# Patient Record
Sex: Male | Born: 1980 | Race: Black or African American | Hispanic: No | Marital: Married
Health system: Southern US, Community
[De-identification: ages and names within clinical notes are randomized; demographics above are authoritative.]

## PROBLEM LIST (undated history)

## (undated) DIAGNOSIS — K449 Diaphragmatic hernia without obstruction or gangrene: Secondary | ICD-10-CM

## (undated) DIAGNOSIS — K219 Gastro-esophageal reflux disease without esophagitis: Secondary | ICD-10-CM

## (undated) DIAGNOSIS — T7840XA Allergy, unspecified, initial encounter: Secondary | ICD-10-CM

## (undated) HISTORY — DX: Diaphragmatic hernia without obstruction or gangrene: K44.9

## (undated) HISTORY — DX: Gastro-esophageal reflux disease without esophagitis: K21.9

## (undated) HISTORY — DX: Allergy, unspecified, initial encounter: T78.40XA

## (undated) HISTORY — PX: EAR CYST EXCISION: SHX22

---

## 2003-09-02 ENCOUNTER — Emergency Department (HOSPITAL_COMMUNITY): Admission: EM | Admit: 2003-09-02 | Discharge: 2003-09-02 | Payer: Self-pay | Admitting: Emergency Medicine

## 2003-09-12 ENCOUNTER — Emergency Department (HOSPITAL_COMMUNITY): Admission: EM | Admit: 2003-09-12 | Discharge: 2003-09-12 | Payer: Self-pay | Admitting: Emergency Medicine

## 2004-01-15 ENCOUNTER — Ambulatory Visit (HOSPITAL_COMMUNITY): Admission: RE | Admit: 2004-01-15 | Discharge: 2004-01-15 | Payer: Self-pay | Admitting: Orthopedic Surgery

## 2005-01-15 ENCOUNTER — Emergency Department (HOSPITAL_COMMUNITY): Admission: EM | Admit: 2005-01-15 | Discharge: 2005-01-15 | Payer: Self-pay | Admitting: Emergency Medicine

## 2008-03-18 ENCOUNTER — Emergency Department (HOSPITAL_COMMUNITY): Admission: EM | Admit: 2008-03-18 | Discharge: 2008-03-18 | Payer: Self-pay | Admitting: Emergency Medicine

## 2008-05-15 ENCOUNTER — Emergency Department (HOSPITAL_COMMUNITY): Admission: EM | Admit: 2008-05-15 | Discharge: 2008-05-15 | Payer: Self-pay | Admitting: Emergency Medicine

## 2010-09-06 ENCOUNTER — Emergency Department (HOSPITAL_COMMUNITY): Admission: EM | Admit: 2010-09-06 | Discharge: 2010-09-06 | Payer: Self-pay | Admitting: Family Medicine

## 2011-02-03 LAB — POCT URINALYSIS DIPSTICK
Bilirubin Urine: NEGATIVE
Glucose, UA: NEGATIVE mg/dL
Hgb urine dipstick: NEGATIVE
Ketones, ur: NEGATIVE mg/dL
Nitrite: NEGATIVE
Protein, ur: 30 mg/dL — AB
Specific Gravity, Urine: >=1.03 (ref 1.005–1.030)
Urobilinogen, UA: 1 mg/dL (ref 0.0–1.0)
pH: 6.5 (ref 5.0–8.0)

## 2011-02-03 LAB — GC/CHLAMYDIA PROBE AMP, GENITAL
Chlamydia, DNA Probe: NEGATIVE
GC Probe Amp, Genital: NEGATIVE

## 2011-08-17 LAB — POCT URINALYSIS DIP (DEVICE)
Bilirubin Urine: NEGATIVE
Glucose, UA: NEGATIVE
Hgb urine dipstick: NEGATIVE
Ketones, ur: NEGATIVE
Nitrite: NEGATIVE
Operator id: 116391
Protein, ur: NEGATIVE
Specific Gravity, Urine: 1.01
Urobilinogen, UA: 0.2
pH: 6.5

## 2012-04-21 ENCOUNTER — Ambulatory Visit: Payer: 59 | Admitting: Emergency Medicine

## 2012-04-21 VITALS — BP 124/73 | HR 74 | Temp 98.5°F | Resp 16 | Ht 70.5 in | Wt 170.0 lb

## 2012-04-21 DIAGNOSIS — R21 Rash and other nonspecific skin eruption: Secondary | ICD-10-CM

## 2012-04-21 DIAGNOSIS — B081 Molluscum contagiosum: Secondary | ICD-10-CM

## 2012-04-21 NOTE — Patient Instructions (Signed)
Molluscum Contagiosum Molluscum contagiosum is a viral infection of the skin that causes smooth surfaced, firm, small (3 to 5 mm), dome-shaped bumps (papules) which are flesh-colored. The bumps usually do not hurt or itch. In children, they most often appear on the face, trunk, arms and legs. In adults, the growths are commonly found on the genitals, thighs, face, neck, and belly (abdomen). The infection may be spread to others by close (skin to skin) contact (such as occurs in schools and swimming pools), sharing towels and clothing, and through sexual contact. The bumps usually disappear without treatment in 2 to 4 months, especially in children. You may have them treated to avoid spreading them. Scraping (curetting) the middle part (central plug) of the bump with a needle or sharp curette, or application of liquid nitrogen for 8 or 9 seconds usually cures the infection. HOME CARE INSTRUCTIONS   Do not scratch the bumps. This may spread the infection to other parts of the body and to other people.   Avoid close contact with others, including sexual contact, until the bumps disappear. Do not share towels or clothing.   If liquid nitrogen was used, blisters will form. Leave the blisters alone and cover with a bandage. The tops will fall off by themselves in 7 to 14 days.   Four months without a lesion is usually a cure.  SEEK IMMEDIATE MEDICAL CARE IF:  You have a fever.   You develop swelling, redness, pain, tenderness, or warmth in the areas of the bumps. They may be infected.  Document Released: 11/05/2000 Document Revised: 10/28/2011 Document Reviewed: 04/18/2009 [employer] Patient Information 2012 [employer]. 

## 2012-04-21 NOTE — Progress Notes (Signed)
  Subjective:    Patient ID: Jimmy Orozco, male    DOB: [birth year], 31 y.o.   MRN: [id number]  HPI patient here with a slightly pruritic rash involving both forearms. He takes is again class II child and wife do not have any bumps    Review of Systems     Objective:   Physical Exam Days red areas on both forearms. There is central umbilication noted. There is no excoriation noted. The web spaces are normal.       Assessment & Plan:  These had the appearance of molluscum I do not feel like these are a scabietic lesions. I suspect he picked them up at the gym.

## 2012-07-28 ENCOUNTER — Ambulatory Visit: Payer: 59 | Admitting: Emergency Medicine

## 2012-07-28 VITALS — BP 118/76 | HR 80 | Temp 98.2°F | Resp 16 | Ht 69.0 in | Wt 177.4 lb

## 2012-07-28 DIAGNOSIS — J40 Bronchitis, not specified as acute or chronic: Secondary | ICD-10-CM

## 2012-07-28 DIAGNOSIS — J018 Other acute sinusitis: Secondary | ICD-10-CM

## 2012-07-28 MED ORDER — AMOXICILLIN-POT CLAVULANATE 875-125 MG PO TABS: 1.0000 | ORAL_TABLET | Freq: Two times a day (BID) | ORAL | Status: AC

## 2012-07-28 MED ORDER — PSEUDOEPHEDRINE-GUAIFENESIN ER 60-600 MG PO TB12: 1.0000 | ORAL_TABLET | Freq: Two times a day (BID) | ORAL | Status: DC

## 2012-07-28 NOTE — Progress Notes (Signed)
    Date:  07/28/2012   Name:  Jimmy Orozco   DOB:  26-Aug-1981   MRN:  [id number] Gender: male Age: 31 y.o.  PCP:  No primary provider on file.    Chief Complaint: Sinusitis   History of Present Illness:  Jimmy Orozco is a 31 y.o. pleasant patient who presents with the following:  Ill with sinus complaints since July 4th weekend when he was in [place].  Has nasal congestion, post nasal drainage and a cough productive of foul tasting sputum that never makes it out of his mouth.  No fever or chills. Pressure in cheeks and upper teeth.  No nausea or vomiting. No rash or stool change.  Has a pustular lesion on his scrotum that has increased in size.    There is no problem list on file for this patient.   No past medical history on file.  No past surgical history on file.  History  Substance Use Topics  . Smoking status: Never Smoker   . Smokeless tobacco: Not on file  . Alcohol Use: Not on file    No family history on file.  Allergies  Allergen Reactions  . Iodine     Medication list has been reviewed and updated.  No current outpatient prescriptions on file prior to visit.    Review of Systems:  As per HPI, otherwise negative.    Physical Examination: Filed Vitals:   07/28/12 1754  BP: 118/76  Pulse: 80  Temp: 98.2 F (36.8 C)  Resp: 16   Filed Vitals:   07/28/12 1754  Height: 5\' 9"  (1.753 m)  Weight: 177 lb 6.4 oz (80.468 kg)   Body mass index is 26.20 kg/(m^2). Ideal Body Weight: Weight in (lb) to have BMI = 25: 168.9    GEN: WDWN, NAD, Non-toxic, Alert & Oriented x 3 HEENT: Atraumatic, Normocephalic. Oropharynx negative Ears and Nose: No external deformity. TM Negative.  Green nasal drainage with mucosal edema CHest:  CTA BS= EXTR: No clubbing/cyanosis/edema NEURO: Normal gait.  PSYCH: Normally interactive. Conversant. Not depressed or anxious appearing.  Calm demeanor.  Genitalia:  Normal male circumcised.  Pustule on  scrotum  Assessment and Plan: Sinusitis Infected sebaceous cyst scrotum augmentin mucinex Follow up as needed  Carmelina Dane, MD

## 2012-08-11 ENCOUNTER — Ambulatory Visit: Payer: 59 | Admitting: Emergency Medicine

## 2012-08-11 ENCOUNTER — Ambulatory Visit: Payer: 59

## 2012-08-11 VITALS — BP 116/72 | HR 76 | Temp 98.4°F | Resp 16 | Ht 69.0 in | Wt 174.0 lb

## 2012-08-11 DIAGNOSIS — M79672 Pain in left foot: Secondary | ICD-10-CM

## 2012-08-11 DIAGNOSIS — M79609 Pain in unspecified limb: Secondary | ICD-10-CM

## 2012-08-11 DIAGNOSIS — S9780XA Crushing injury of unspecified foot, initial encounter: Secondary | ICD-10-CM

## 2012-08-11 DIAGNOSIS — Z23 Encounter for immunization: Secondary | ICD-10-CM

## 2012-08-11 MED ORDER — NAPROXEN SODIUM 550 MG PO TABS: 550.0000 mg | ORAL_TABLET | Freq: Two times a day (BID) | ORAL | Status: DC

## 2012-08-11 NOTE — Progress Notes (Signed)
  Date:  08/11/2012   Name:  Jimmy Orozco   DOB:  Jun 30, 1981   MRN:  [id number] Gender: male Age: 31 y.o.  PCP:  Katy Apo, MD    Chief Complaint: Foot Injury   History of Present Illness:  Jimmy Orozco is a 31 y.o. pleasant patient who presents with the following:  Injured yesterday afternoon when a cart rolled over his foot.  Says that he soaked his foot in hot water and it was worse and then his wife had him put ice on it and it improved.  Still unable to easily bear weight.  Denies other complaints.   There is no problem list on file for this patient.   No past medical history on file.  No past surgical history on file.  History  Substance Use Topics  . Smoking status: Never Smoker   . Smokeless tobacco: Not on file  . Alcohol Use: Not on file    No family history on file.  Allergies  Allergen Reactions  . Iodine     Medication list has been reviewed and updated.  Outpatient Prescriptions Prior to Visit  Medication Sig Dispense Refill  . pseudoephedrine-guaifenesin (MUCINEX D) 60-600 MG per tablet Take 1 tablet by mouth every 12 (twelve) hours.  18 tablet  0    Review of Systems:  As per HPI, otherwise negative.    Physical Examination: Filed Vitals:   08/11/12 0934  BP: 116/72  Pulse: 76  Temp: 98.4 F (36.9 C)  Resp: 16   Filed Vitals:   08/11/12 0934  Height: 5\' 9"  (1.753 m)  Weight: 174 lb (78.926 kg)   Body mass index is 25.70 kg/(m^2). Ideal Body Weight: Weight in (lb) to have BMI = 25: 168.9    GEN: WDWN, NAD, Non-toxic, Alert & Oriented x 3 HEENT: Atraumatic, Normocephalic.  Ears and Nose: No external deformity. EXTR: No clubbing/cyanosis/edema NEURO: Normal gait.  PSYCH: Normally interactive. Conversant. Not depressed or anxious appearing.  Calm demeanor.  Foot:  Tender dorsum of left foot.  No deformity or ecchymosis.  NATI.  Ankle stable and not tender.  Assessment and Plan: Contusion  foot Anaprox Vedia Coffer, MD  [facility] reading (PRIMARY) by  Dr. Dareen Piano No fracture.  I have reviewed and agree with documentation. Robert P. Merla Riches, M.D.

## 2013-06-28 ENCOUNTER — Ambulatory Visit (INDEPENDENT_AMBULATORY_CARE_PROVIDER_SITE_OTHER): Payer: 59 | Admitting: Family Medicine

## 2013-06-28 VITALS — BP 122/84 | HR 70 | Temp 98.0°F | Resp 16 | Ht 70.0 in | Wt 177.8 lb

## 2013-06-28 DIAGNOSIS — R12 Heartburn: Secondary | ICD-10-CM

## 2013-06-28 MED ORDER — RANITIDINE HCL 75 MG PO TABS: 75.0000 mg | ORAL_TABLET | Freq: Two times a day (BID) | ORAL | Status: DC

## 2013-06-28 NOTE — Progress Notes (Signed)
Subjective:    Patient ID: Jimmy Orozco, male    DOB: [birth year], 32 y.o.   MRN: [id number]  HPI Jimmy Orozco is a 32 y.o. male  Past few weeks - burning sensation in L chest.  Notices with eating at times. Unsure if specific foods.  Notes sometimes in the am - feeling of something in chest/stuck, then resolves on own. No dyspnea, /n/v/diaphoresis. No arm or neck radiation. Does have some belching at times. Has had water brash. No recent NSAIDs. otc MVI.  Eats middle of night at times. No recent calf pain or swelling.  SH:  Less than 1 caffeinated drink per day.  Dose eat [employer] cups - at least 4 per day. Has been eating more recently. Nonsmoker, but 2nd hand smoke. Does eat fried foods/fast foods.  Spicy food at times. Wine on weekends - 3-4 per day. None during the week.   Tx: none.   FH: no cardiac disease/MI/CAD, no FH of sudden death.   Review of Systems  Constitutional: Negative for fever, chills, diaphoresis, appetite change, fatigue and unexpected weight change.  Respiratory: Negative for cough, chest tightness and shortness of breath.   Cardiovascular: Positive for chest pain (burning. ). Negative for palpitations and leg swelling.  Gastrointestinal: Negative for nausea, vomiting, abdominal pain, diarrhea, blood in stool and anal bleeding.  Musculoskeletal: Negative for back pain and joint swelling.  Skin: Negative for rash.       Objective:   Physical Exam  Vitals reviewed. Constitutional: He is oriented to person, place, and time. He appears well-developed and well-nourished.  HENT:  Head: Normocephalic and atraumatic.  Eyes: EOM are normal. Pupils are equal, round, and reactive to light.  Neck: No JVD present. Carotid bruit is not present.  Cardiovascular: Normal rate, regular rhythm and normal heart sounds.   No murmur heard. Pulmonary/Chest: Effort normal and breath sounds normal. He has no rales.  nontender   Abdominal: Soft. Bowel sounds are normal. He  exhibits no distension. There is no tenderness.  Musculoskeletal: He exhibits no edema.  Neurological: He is alert and oriented to person, place, and time.  Skin: Skin is warm and dry.  Psychiatric: He has a normal mood and affect.      Assessment & Plan:  [name] is a 32 y.o. male Heartburn - Plan: ranitidine (ZANTAC) 75 MG tablet  Discussed decrease in chocolate, other trigger foods, trial of short term zantac and if not improving in 4-5 days - recheck. RTC/ER precautions discussed.   Meds ordered this encounter  Medications  . ranitidine (ZANTAC) 75 MG tablet    Sig: Take 1 tablet (75 mg total) by mouth 2 (two) times daily.    Dispense:  30 tablet    Refill:  0   , Patient Instructions  Try zantac twice per day, avoid spicy foods, fried/fast foods as much as possible. Avoid caffeine - including chocolate.  Other instructions below. Return to the clinic or go to the nearest emergency room if any of your symptoms worsen or new symptoms occur, and if not improving with above in next 4-5 days - recheck - sooner if worse.   Heartburn Heartburn is a painful, burning sensation in the chest. It may feel worse in certain positions, such as lying down or bending over. It is caused by stomach acid backing up into the tube that carries food from the mouth down to the stomach (lower esophagus).  CAUSES   Large meals.  Certain foods and  drinks.  Exercise.  Increased acid production.  Being overweight or obese.  Certain medicines. SYMPTOMS   Burning pain in the chest or lower throat.  Bitter taste in the mouth.  Coughing. DIAGNOSIS  If the usual treatments for heartburn do not improve your symptoms, then tests may be done to see if there is another condition present. Possible tests may include:  X-rays.  Endoscopy. This is when a tube with a light and a camera on the end is used to examine the esophagus and the stomach.  A test to measure the amount of acid in the  esophagus (pH test).  A test to see if the esophagus is working properly (esophageal manometry).  Blood, breath, or stool tests to check for bacteria that cause ulcers. TREATMENT   Your caregiver may tell you to use certain over-the-counter medicines (antacids, acid reducers) for mild heartburn.  Your caregiver may prescribe medicines to decrease the acid in your stomach or protect your stomach lining.  Your caregiver may recommend certain diet changes.  For severe cases, your caregiver may recommend that the head of your bed be elevated on blocks. (Sleeping with more pillows is not an effective treatment as it only changes the position of your head and does not improve the main problem of stomach acid refluxing into the esophagus.) HOME CARE INSTRUCTIONS   Take all medicines as directed by your caregiver.  Raise the head of your bed by putting blocks under the legs if instructed to by your caregiver.  Do not exercise right after eating.  Avoid eating 2 or 3 hours before bed. Do not lie down right after eating.  Eat small meals throughout the day instead of 3 large meals.  Stop smoking if you smoke.  Maintain a healthy weight.  Identify foods and beverages that make your symptoms worse and avoid them. Foods you may want to avoid include:  Peppers.  Chocolate.  High-fat foods, including fried foods.  Spicy foods.  Garlic and onions.  Citrus fruits, including oranges, grapefruit, lemons, and limes.  Food containing tomatoes or tomato products.  Mint.  Carbonated drinks, caffeinated drinks, and alcohol.  Vinegar. SEEK IMMEDIATE MEDICAL CARE IF:  You have severe chest pain that goes down your arm or into your jaw or neck.  You feel sweaty, dizzy, or lightheaded.  You are short of breath.  You vomit blood.  You have difficulty or pain with swallowing.  You have bloody or black, tarry stools.  You have episodes of heartburn more than 3 times a week for more  than 2 weeks. MAKE SURE YOU:  Understand these instructions.  Will watch your condition.  Will get help right away if you are not doing well or get worse. Document Released: 03/27/2009 Document Revised: 01/31/2012 Document Reviewed: 04/25/2011 [facility] Patient Information 2014 [place], [place].

## 2013-06-28 NOTE — Patient Instructions (Signed)
Try zantac twice per day, avoid spicy foods, fried/fast foods as much as possible. Avoid caffeine - including chocolate.  Other instructions below. Return to the clinic or go to the nearest emergency room if any of your symptoms worsen or new symptoms occur, and if not improving with above in next 4-5 days - recheck - sooner if worse.   Heartburn Heartburn is a painful, burning sensation in the chest. It may feel worse in certain positions, such as lying down or bending over. It is caused by stomach acid backing up into the tube that carries food from the mouth down to the stomach (lower esophagus).  CAUSES   Large meals.  Certain foods and drinks.  Exercise.  Increased acid production.  Being overweight or obese.  Certain medicines. SYMPTOMS   Burning pain in the chest or lower throat.  Bitter taste in the mouth.  Coughing. DIAGNOSIS  If the usual treatments for heartburn do not improve your symptoms, then tests may be done to see if there is another condition present. Possible tests may include:  X-rays.  Endoscopy. This is when a tube with a light and a camera on the end is used to examine the esophagus and the stomach.  A test to measure the amount of acid in the esophagus (pH test).  A test to see if the esophagus is working properly (esophageal manometry).  Blood, breath, or stool tests to check for bacteria that cause ulcers. TREATMENT   Your caregiver may tell you to use certain over-the-counter medicines (antacids, acid reducers) for mild heartburn.  Your caregiver may prescribe medicines to decrease the acid in your stomach or protect your stomach lining.  Your caregiver may recommend certain diet changes.  For severe cases, your caregiver may recommend that the head of your bed be elevated on blocks. (Sleeping with more pillows is not an effective treatment as it only changes the position of your head and does not improve the main problem of stomach acid  refluxing into the esophagus.) HOME CARE INSTRUCTIONS   Take all medicines as directed by your caregiver.  Raise the head of your bed by putting blocks under the legs if instructed to by your caregiver.  Do not exercise right after eating.  Avoid eating 2 or 3 hours before bed. Do not lie down right after eating.  Eat small meals throughout the day instead of 3 large meals.  Stop smoking if you smoke.  Maintain a healthy weight.  Identify foods and beverages that make your symptoms worse and avoid them. Foods you may want to avoid include:  Peppers.  Chocolate.  High-fat foods, including fried foods.  Spicy foods.  Garlic and onions.  Citrus fruits, including oranges, grapefruit, lemons, and limes.  Food containing tomatoes or tomato products.  Mint.  Carbonated drinks, caffeinated drinks, and alcohol.  Vinegar. SEEK IMMEDIATE MEDICAL CARE IF:  You have severe chest pain that goes down your arm or into your jaw or neck.  You feel sweaty, dizzy, or lightheaded.  You are short of breath.  You vomit blood.  You have difficulty or pain with swallowing.  You have bloody or black, tarry stools.  You have episodes of heartburn more than 3 times a week for more than 2 weeks. MAKE SURE YOU:  Understand these instructions.  Will watch your condition.  Will get help right away if you are not doing well or get worse. Document Released: 03/27/2009 Document Revised: 01/31/2012 Document Reviewed: 04/25/2011 [employer] Patient Information 2014  [employer].

## 2013-06-30 ENCOUNTER — Emergency Department (HOSPITAL_COMMUNITY): Payer: 59

## 2013-06-30 ENCOUNTER — Emergency Department (HOSPITAL_COMMUNITY)
Admission: EM | Admit: 2013-06-30 | Discharge: 2013-06-30 | Disposition: A | Discharge status: Home / Self Care | Payer: 59 | Attending: Emergency Medicine | Admitting: Emergency Medicine

## 2013-06-30 ENCOUNTER — Encounter (HOSPITAL_COMMUNITY): Payer: Self-pay | Admitting: Emergency Medicine

## 2013-06-30 DIAGNOSIS — R079 Chest pain, unspecified: Secondary | ICD-10-CM

## 2013-06-30 DIAGNOSIS — R0789 Other chest pain: Secondary | ICD-10-CM | POA: Insufficient documentation

## 2013-06-30 LAB — BASIC METABOLIC PANEL
BUN: 13 mg/dL (ref 6–23)
CO2: 27 mEq/L (ref 19–32)
Calcium: 9.4 mg/dL (ref 8.4–10.5)
Chloride: 98 mEq/L (ref 96–112)
Creatinine, Ser: 1.09 mg/dL (ref 0.50–1.35)
GFR calc Af Amer: >90 mL/min (ref >90)
GFR calc non Af Amer: 88 mL/min — ABNORMAL LOW (ref >90)
Glucose, Bld: 78 mg/dL (ref 70–99)
Potassium: 3.5 mEq/L (ref 3.5–5.1)
Sodium: 137 mEq/L (ref 135–145)

## 2013-06-30 LAB — CBC
HCT: 41.5 % (ref 39.0–52.0)
Hemoglobin: 13.8 g/dL (ref 13.0–17.0)
MCH: 28.8 pg (ref 26.0–34.0)
MCHC: 33.3 g/dL (ref 30.0–36.0)
MCV: 86.5 fL (ref 78.0–100.0)
Platelets: 239 10*3/uL (ref 150–400)
RBC: 4.8 MIL/uL (ref 4.22–5.81)
RDW: 13.5 % (ref 11.5–15.5)
WBC: 4.7 10*3/uL (ref 4.0–10.5)

## 2013-06-30 LAB — POCT I-STAT TROPONIN I: Troponin i, poc: 0 ng/mL (ref 0.00–0.08)

## 2013-06-30 MED ORDER — OMEPRAZOLE 20 MG PO CPDR: 20.0000 mg | DELAYED_RELEASE_CAPSULE | Freq: Every day | ORAL | Status: DC

## 2013-06-30 NOTE — ED Provider Notes (Signed)
CSN: [id number]     Arrival date & time 06/30/13  1746 History     First MD Initiated Contact with Patient 06/30/13 1759     Chief Complaint  Patient presents with  . Chest Pain   (Consider location/radiation/quality/duration/timing/severity/associated sxs/prior Treatment) HPI Comments: Pt comes in with cc of chest pain. Chest pain x 2 weeks, went to an urgent care, and started on antacids, that has taken the burning away. The pain is intermittent, unprovoked, and lasts for few seconds. The pain is left sided, difficult to explain. No associated nausea, emesis, diaphoresis. Pt is active, and has no premature CAD, and doesn't smoke, or use drugs.  Patient is a 32 y.o. male presenting with chest pain. The history is provided by the patient.  Chest Pain Associated symptoms: no cough, no dizziness, no fever, no headache and no shortness of breath     History reviewed. No pertinent past medical history. History reviewed. No pertinent past surgical history. Family History  Problem Relation Age of Onset  . Diabetes Mother   . Hypertension Mother   . Hypertension Father   . Diabetes Maternal Grandmother   . Hypertension Paternal Grandfather    History  Substance Use Topics  . Smoking status: Never Smoker   . Smokeless tobacco: Not on file  . Alcohol Use: Not on file    Review of Systems  Constitutional: Negative for fever, chills and activity change.  HENT: Negative for neck pain.   Eyes: Negative for visual disturbance.  Respiratory: Negative for cough, chest tightness and shortness of breath.   Cardiovascular: Positive for chest pain.  Gastrointestinal: Negative for abdominal distention.  Genitourinary: Negative for dysuria, enuresis and difficulty urinating.  Musculoskeletal: Negative for arthralgias.  Neurological: Negative for dizziness, light-headedness and headaches.  Psychiatric/Behavioral: Negative for confusion.    Allergies  Iodine  Home Medications   Current  Outpatient Rx  Name  Route  Sig  Dispense  Refill  . ranitidine (ZANTAC) 150 MG tablet   Oral   Take 150 mg by mouth 2 (two) times daily.          BP 150/81  Pulse 86  Temp(Src) 98.2 F (36.8 C)  Resp 20  SpO2 100% Physical Exam  Nursing note and vitals reviewed. Constitutional: He is oriented to person, place, and time. He appears well-developed.  HENT:  Head: Normocephalic and atraumatic.  Eyes: Conjunctivae and EOM are normal. Pupils are equal, round, and reactive to light.  Neck: Normal range of motion. Neck supple.  Cardiovascular: Normal rate and regular rhythm.   Pulmonary/Chest: Effort normal and breath sounds normal.  Abdominal: Soft. Bowel sounds are normal. He exhibits no distension. There is no tenderness. There is no rebound and no guarding.  Neurological: He is alert and oriented to person, place, and time.  Skin: Skin is warm.    ED Course   Procedures (including critical care time)  Labs Reviewed  BASIC METABOLIC PANEL - Abnormal; Notable for the following:    GFR calc non Af Amer 88 (*)    All other components within normal limits  CBC  POCT I-STAT TROPONIN I   No results found. No diagnosis found.  MDM   Date: 06/30/2013  Rate: 89  Rhythm: normal sinus rhythm  QRS Axis: normal  Intervals: normal  ST/T Wave abnormalities: normal  Conduction Disutrbances: none  Narrative Interpretation: unremarkable  Pt comes in with cc of chest pain. Pt has atypical chest pain, no risk factors. Appears to be more  of a GI problem. Requested PCP f/u - might need GI follow up.  Derwood Kaplan, MD 06/30/13 [phone]

## 2013-06-30 NOTE — ED Notes (Signed)
Pt reports ongoing chest pain that has been going on for 2 weeks. Pt has tried taking an antacid and reports the "tightness" changes to "burning". Pt denies SOB, n/v, or dizziness.

## 2013-08-21 ENCOUNTER — Ambulatory Visit: Payer: 59

## 2013-08-21 ENCOUNTER — Ambulatory Visit (INDEPENDENT_AMBULATORY_CARE_PROVIDER_SITE_OTHER): Payer: 59 | Admitting: Family Medicine

## 2013-08-21 VITALS — BP 110/74 | HR 86 | Temp 97.9°F | Resp 16 | Ht 69.0 in | Wt 168.0 lb

## 2013-08-21 DIAGNOSIS — R079 Chest pain, unspecified: Secondary | ICD-10-CM

## 2013-08-21 DIAGNOSIS — M940 Chondrocostal junction syndrome [Tietze]: Secondary | ICD-10-CM

## 2013-08-21 MED ORDER — MELOXICAM 15 MG PO TABS: 15.0000 mg | ORAL_TABLET | Freq: Every day | ORAL | Status: DC

## 2013-08-21 NOTE — Patient Instructions (Signed)
Costochondritis Costochondritis (Tietze syndrome), or costochondral separation, is a swelling and irritation (inflammation) of the tissue (cartilage) that connects your ribs with your breastbone (sternum). It may occur on its own (spontaneously), through damage caused by an accident (trauma), or simply from coughing or minor exercise. It may take up to 6 weeks to get better and longer if you are unable to be conservative in your activities. HOME CARE INSTRUCTIONS   Avoid exhausting physical activity. Try not to strain your ribs during normal activity. This would include any activities using chest, belly (abdominal), and side muscles, especially if heavy weights are used.  Use ice for 15-20 minutes per hour while awake for the first 2 days. Place the ice in a plastic bag, and place a towel between the bag of ice and your skin.  Only take over-the-counter or prescription medicines for pain, discomfort, or fever as directed by your caregiver. SEEK IMMEDIATE MEDICAL CARE IF:   Your pain increases or you are very uncomfortable.  You have a fever.  You develop difficulty with your breathing.  You cough up blood.  You develop worse chest pains, shortness of breath, sweating, or vomiting.  You develop new, unexplained problems (symptoms). MAKE SURE YOU:   Understand these instructions.  Will watch your condition.  Will get help right away if you are not doing well or get worse. Document Released: 08/18/2005 Document Revised: 01/31/2012 Document Reviewed: 06/26/2008 [employer] Patient Information 2014 [employer].  

## 2013-08-21 NOTE — Progress Notes (Signed)
Subjective:    Patient ID: Jimmy Orozco, male    DOB: 16-Jul-1981, 32 y.o.   MRN: [id number] Chief Complaint  Patient presents with  . Chest Pain    More during cough and workout    HPI   About 7 wks ago was having lateral left chest burning. Was seen here and at the ER with a nml CXR and labs.  Then his PCP but him on a ppi after which that pain completely resolved. However, in the last month, he has now developed left sternal pain - especially when weight lifting and doing presses at the gym. PCP diagnosed this as costochondritis and put him on ibuprofen which helped substantially. Has been doing more aerobic exercise and stopped lifting at the gym and the pain largely resolved but last week he went to a cycling class and then pain recurred after about 30 minutes - esp when he would stand up with his hands on the bike handles. He also feels some fatty soft fullness/mass over the same area of pain and this is also very tender to light touch.  Past Medical History  Diagnosis Date  . Allergy    Current Outpatient Prescriptions on File Prior to Visit  Medication Sig Dispense Refill  . omeprazole (PRILOSEC) 20 MG capsule Take 1 capsule (20 mg total) by mouth daily.  30 capsule  0  . ranitidine (ZANTAC) 150 MG tablet Take 150 mg by mouth 2 (two) times daily.       No current facility-administered medications on file prior to visit.   Allergies  Allergen Reactions  . Iodine    Review of Systems  Constitutional: Negative for fever and chills.  Eyes: Negative for visual disturbance.  Respiratory: Negative for cough, chest tightness and shortness of breath.   Cardiovascular: Positive for chest pain. Negative for leg swelling.  Gastrointestinal: Negative for nausea and abdominal pain.  Musculoskeletal: Negative for back pain and joint swelling.  Skin: Negative for color change, rash and wound.  Neurological: Negative for dizziness, syncope, facial asymmetry, weakness, light-headedness and  headaches.  Hematological: Negative for adenopathy.      BP 110/74  Pulse 86  Temp(Src) 97.9 F (36.6 C) (Oral)  Resp 16  Ht 5\' 9"  (1.753 m)  Wt 168 lb (76.204 kg)  BMI 24.8 kg/m2  SpO2 99% Objective:   Physical Exam  Constitutional: He is oriented to person, place, and time. He appears well-developed and well-nourished. No distress.  HENT:  Head: Normocephalic and atraumatic.  Eyes: Conjunctivae are normal. Pupils are equal, round, and reactive to light. No scleral icterus.  Neck: Normal range of motion. Neck supple. No thyromegaly present.  Cardiovascular: Normal rate, regular rhythm, normal heart sounds and intact distal pulses.   Pulmonary/Chest: Effort normal and breath sounds normal. No respiratory distress. He exhibits tenderness and bony tenderness. Left breast exhibits mass and tenderness.    Musculoskeletal: He exhibits no edema.  Lymphadenopathy:    He has no cervical adenopathy.  Neurological: He is alert and oriented to person, place, and time.  Skin: Skin is warm and dry. He is not diaphoretic.  Psychiatric: He has a normal mood and affect. His behavior is normal.         [facility] reading (PRIMARY) by  Dr. Clelia Croft. CXR: No acute abnormality Assessment & Plan:  Chest pain - Plan: DG Chest 2 View  Costochondritis - Plan: Ambulatory referral to Sports Medicine   There is a very poor defined soft tissue fullness over the  same area of costochondritis pain that he is having - could be lipoma vs soft tissue inflammation from costochondritis. Rec daily meloxicam with qid ice, gradually resume nml activities. F/u w/ sports medicine and consider US imaging of area if pain continues in order to further define soft tissue fullness or rib xrays to better visualize area of pain as area could not be clearly seen on xray due to multiple overlying structures. Meds ordered this encounter  Medications  . meloxicam (MOBIC) 15 MG tablet    Sig: Take 1 tablet (15 mg total) by mouth  daily.    Dispense:  30 tablet    Refill:  1

## 2013-08-24 ENCOUNTER — Encounter: Payer: Self-pay | Admitting: Family Medicine

## 2013-08-24 ENCOUNTER — Ambulatory Visit (INDEPENDENT_AMBULATORY_CARE_PROVIDER_SITE_OTHER): Payer: 59 | Admitting: Family Medicine

## 2013-08-24 VITALS — BP 137/84 | HR 75 | Ht 69.0 in | Wt 168.0 lb

## 2013-08-24 DIAGNOSIS — R222 Localized swelling, mass and lump, trunk: Secondary | ICD-10-CM

## 2013-08-27 DIAGNOSIS — S29011A Strain of muscle and tendon of front wall of thorax, initial encounter: Secondary | ICD-10-CM | POA: Insufficient documentation

## 2013-08-27 NOTE — Assessment & Plan Note (Signed)
Appears consistent with localized pec major strain.  No acute intervention needed at this time. Re-eval if worsens.

## 2013-08-27 NOTE — Progress Notes (Signed)
Patient ID: KALYAN BARABAS, male   DOB: 1981-03-18, 32 y.o.   MRN: [id number] 32 yo male with complaint of lump along left sternal border.  Unsure of onset.  Referred for further evaluation and ultrasound.  Mild discomfort.  Has not changed in size.  Denies specific inciting event. No prior episodes.  Does not change in size with time.  Patient does admit to workouts involving chest muscle strengthening prior to noticing.  Past Medical History  Diagnosis Date  . Allergy    Past Surgical History  Procedure Laterality Date  . Ear cyst excision     Allergies  Allergen Reactions  . Iodine    ROS:  As per HPI, otherwise negative.  Examination: BP 137/84  Pulse 75  Ht 5\' 9"  (1.753 m)  Wt 168 lb (76.204 kg)  BMI 24.8 kg/m2  This is a well developed well nourished 32 yo african [place] male in no acute distress.  Chest:  Nontender, palpable .05cm soft mass just lateral to left sternal border at level of 4th/5th rib.  No overlying erythema.  Pec strength 5/5.  No warmth.  RRR, no m/r/g.  CTA B/L  MSK ultrasound:  Ultrasound reveals small area of slight hypoechogenicity within medial border of pec major, otherwise normal.  A/P: Findings consistent with possible small pec major strain with small fluid collection.  Advise observation and re-eval if worsens.

## 2013-09-04 ENCOUNTER — Other Ambulatory Visit: Payer: Self-pay | Admitting: Internal Medicine

## 2013-09-04 ENCOUNTER — Ambulatory Visit
Admission: RE | Admit: 2013-09-04 | Discharge: 2013-09-04 | Disposition: A | Discharge status: Home / Self Care | Payer: 59 | Source: Ambulatory Visit | Attending: Internal Medicine | Admitting: Internal Medicine

## 2013-09-04 ENCOUNTER — Telehealth: Payer: Self-pay | Admitting: *Deleted

## 2013-09-04 DIAGNOSIS — K219 Gastro-esophageal reflux disease without esophagitis: Secondary | ICD-10-CM

## 2013-09-04 NOTE — Telephone Encounter (Signed)
Pt is seeing Dr. Nehemiah Settle and would like to get a copy of last visit with Dr. Jennette Kettle and any xrays that she ordered.  Please fax to [phone] Attn Dr. Nehemiah Settle.  Brandilee Pies,CMA

## 2013-09-04 NOTE — Telephone Encounter (Signed)
Dr Donnetta Hail notes,labs and x-rays  were faxed to Dr Shirley Muscat. Jimmy Orozco, Virgel Bouquet

## 2013-09-07 ENCOUNTER — Ambulatory Visit (INDEPENDENT_AMBULATORY_CARE_PROVIDER_SITE_OTHER): Payer: 59 | Admitting: Family Medicine

## 2013-09-07 VITALS — BP 127/85 | HR 76 | Ht 69.0 in | Wt 168.0 lb

## 2013-09-07 DIAGNOSIS — IMO0002 Reserved for concepts with insufficient information to code with codable children: Secondary | ICD-10-CM

## 2013-09-07 DIAGNOSIS — S29011S Strain of muscle and tendon of front wall of thorax, sequela: Secondary | ICD-10-CM

## 2013-09-07 NOTE — Assessment & Plan Note (Signed)
Advised patient to start doing pushups within eccentric drop. Continue when necessary ibuprofen.

## 2013-09-07 NOTE — Progress Notes (Signed)
Patient ID: Jimmy Orozco, male   DOB: [birth year], 32 y.o.   MRN: [id number] 32 year old male seen in followup for left parasternal pain. Been taking Motrin with moderate relief. Presents today with continued symptoms. Reports not doing stretching/strengthening exercises. He is hesitant that will exacerbate the problem. No new injury.  Review of systems: As per history of present illness otherwise negative.  Examination:  BP 127/85  Pulse 76  Ht 5\' 9"  (1.753 m)  Wt 168 lb (76.204 kg)  BMI 24.8 kg/m2 Well-developed well-nourished 32 year old [place] American male awake alert and oriented in no acute distress  Examination of the chest reveals mild tenderness to palpation in the left parasternal border. No palpable mass noted. Normal respiratory rate.  Range of motion intact in bilateral shoulders, however, pain reproduced with abduction and extreme extension.  Strength 5/5 bilaterally.

## 2013-09-27 ENCOUNTER — Other Ambulatory Visit: Payer: Self-pay

## 2013-11-27 ENCOUNTER — Ambulatory Visit (INDEPENDENT_AMBULATORY_CARE_PROVIDER_SITE_OTHER): Payer: 59 | Admitting: Family Medicine

## 2013-11-27 ENCOUNTER — Encounter: Payer: Self-pay | Admitting: Family Medicine

## 2013-11-27 VITALS — BP 127/82 | Ht 69.0 in | Wt 168.0 lb

## 2013-11-27 DIAGNOSIS — S29011D Strain of muscle and tendon of front wall of thorax, subsequent encounter: Secondary | ICD-10-CM

## 2013-11-27 DIAGNOSIS — Z5189 Encounter for other specified aftercare: Secondary | ICD-10-CM

## 2013-11-27 NOTE — Progress Notes (Signed)
CC: Followup pectoralis strain HPI: Patient is a pleasant 33 year old male who presents for followup of pectoralis major strain. He presented with left parasternal pain and tenderness with Dr. Jennette KettleNeal in October. She performed ultrasound which she felt showed a small tear in the pectoralis major muscle with hypoechoic change surrounding. On followup he was clinically improving. Patient returns today again for followup. He states that he does continue to improve but he is concerned because when he tried to do pull-ups he still had mild discomfort. He did take a period of time off of rest and then started working out about 2 weeks ago. He has been able to do cardio as well as a bike workout without pain. He has not really tried much lifting. He did note that when he did pull-ups he got some discomfort in the same area. He continues to feel like he has a "lump" next to his sternum on the left in the area of his pain.  ROS: As above in the HPI. All other systems are stable or negative.  OBJECTIVE: APPEARANCE:  Patient in no acute distress.The patient appeared well nourished and normally developed. HEENT: No scleral icterus. Conjunctiva non-injected Resp: Non labored Skin: No rash MSK:  - No palpable mass or deformity. I suspect that the "lump" that he is feeling is actually the rib and costosternal joint. He is mildly tender focally in this area. He has full range of motion at the shoulder. There is mild discomfort with putting the pectoralis major on a stretch with scapular retraction.   MSK US: Limited ultrasound of the chest wall was performed today. I was able to visualize the patient's area of pain. I see normal-appearing sternal bone as well as normal appearing costosternal joints and rib cartilage. At a do not see any further evidence of muscle tear or hypoechoic change.   ASSESSMENT: #1. Pectoralis major muscle strain, improving   PLAN: I offered reassurance to the patient. I do think he  continues to improve clinically. I recommended he continue his cardiovascular exercise and bike workout. I did give him some exercises to begin for chest rehabilitation. I've asked him to do assisted pull-ups beginning with 100 pounds of assistance. He should also do eccentric partner bench press where his partner helps him with the way and he lowers it independently. He should do a partner fly a similar way. He needs to back off if he experiences any pain during, after, or the day after his exercise. He is to return in 6 weeks if he is not improved. That time we may need to consider ultrasound-guided costosternal injection as this seems to be the location of the patient's most focal pain.

## 2013-11-27 NOTE — Patient Instructions (Signed)
Thank you for coming in today  Do the following exercises at the gym: 1. Partner BP - Partner helps lift bar up, you slowly lower bar alone 2. Partner Flys - Partner helps lift weights together, you slowly lower weight alone 3. Assisted pullup - start with 100 lbs of assistance and do 10 reps

## 2014-10-03 ENCOUNTER — Other Ambulatory Visit: Payer: Self-pay | Admitting: Gastroenterology

## 2014-10-03 DIAGNOSIS — R1032 Left lower quadrant pain: Secondary | ICD-10-CM

## 2014-10-08 ENCOUNTER — Ambulatory Visit
Admission: RE | Admit: 2014-10-08 | Discharge: 2014-10-08 | Disposition: A | Discharge status: Home / Self Care | Payer: 59 | Source: Ambulatory Visit | Attending: Gastroenterology | Admitting: Gastroenterology

## 2014-10-08 ENCOUNTER — Inpatient Hospital Stay
Admission: RE | Admit: 2014-10-08 | Discharge: 2014-10-08 | Disposition: A | Discharge status: Home / Self Care | Payer: 59 | Source: Ambulatory Visit | Attending: Gastroenterology | Admitting: Gastroenterology

## 2014-10-08 DIAGNOSIS — R1032 Left lower quadrant pain: Secondary | ICD-10-CM

## 2014-10-08 MED ORDER — IOHEXOL 300 MG/ML  SOLN
100.0000 mL | Freq: Once | INTRAMUSCULAR | Status: AC | PRN
  Administered 2014-10-08: 100 mL via INTRAVENOUS

## 2014-12-23 ENCOUNTER — Ambulatory Visit
Admission: RE | Admit: 2014-12-23 | Discharge: 2014-12-23 | Disposition: A | Discharge status: Home / Self Care | Payer: 59 | Source: Ambulatory Visit | Attending: Nurse Practitioner | Admitting: Nurse Practitioner

## 2014-12-23 ENCOUNTER — Other Ambulatory Visit: Payer: Self-pay | Admitting: Nurse Practitioner

## 2014-12-23 DIAGNOSIS — J329 Chronic sinusitis, unspecified: Secondary | ICD-10-CM

## 2015-01-07 ENCOUNTER — Other Ambulatory Visit: Payer: Self-pay | Admitting: Emergency Medicine

## 2015-01-07 DIAGNOSIS — G8929 Other chronic pain: Secondary | ICD-10-CM

## 2015-01-07 DIAGNOSIS — R51 Headache: Principal | ICD-10-CM

## 2015-01-07 DIAGNOSIS — R519 Headache, unspecified: Secondary | ICD-10-CM

## 2015-01-14 ENCOUNTER — Other Ambulatory Visit: Payer: 59

## 2015-01-28 ENCOUNTER — Ambulatory Visit
Admission: RE | Admit: 2015-01-28 | Discharge: 2015-01-28 | Disposition: A | Discharge status: Home / Self Care | Payer: 59 | Source: Ambulatory Visit | Attending: Emergency Medicine | Admitting: Emergency Medicine

## 2015-01-28 DIAGNOSIS — R51 Headache: Principal | ICD-10-CM

## 2015-01-28 DIAGNOSIS — R519 Headache, unspecified: Secondary | ICD-10-CM

## 2015-01-28 DIAGNOSIS — G8929 Other chronic pain: Secondary | ICD-10-CM

## 2015-01-28 MED ORDER — GADOBENATE DIMEGLUMINE 529 MG/ML IV SOLN
10.0000 mL | Freq: Once | INTRAVENOUS | Status: AC | PRN
  Administered 2015-01-28: 10 mL via INTRAVENOUS

## 2015-09-15 ENCOUNTER — Ambulatory Visit (INDEPENDENT_AMBULATORY_CARE_PROVIDER_SITE_OTHER): Payer: 59 | Admitting: Family Medicine

## 2015-09-15 ENCOUNTER — Encounter: Payer: Self-pay | Admitting: Family Medicine

## 2015-09-15 VITALS — BP 130/86 | HR 78 | Ht 69.0 in | Wt 190.0 lb

## 2015-09-15 DIAGNOSIS — R0689 Other abnormalities of breathing: Secondary | ICD-10-CM

## 2015-09-15 DIAGNOSIS — J989 Respiratory disorder, unspecified: Secondary | ICD-10-CM | POA: Diagnosis not present

## 2015-09-15 NOTE — Progress Notes (Signed)
   Subjective:    Patient ID: Jimmy Orozco, male    DOB: November 05, 1981, 34 y.o.   MRN: [id number]  HPI He is here for evaluation of his chest wall and breathing. He has had some unintentional weight gain due to inactivity over the last year. He's also had some stress with possible relocation of his job. He's noticed that he has more reflux and that when he sitting still he sometimes has to take a deep breath because he feels like his breathing is "off". Feels like she's having to take more breaths per hour than usual. He's had a little bit of a nonproductive cough. He ran 1 mile a couple of nights ago and had no chest pain. He got little short of breath with exertion but was able to get through the mile without stopping. Afterward he had no problems with recovery.   Review of Systems Small amount of weight gain in the last year, unintentional. Cough is nonproductive. No shortness of breath. Does not awaken at night short of breath, does not use pillows at night because of shortness of breath. No fever, sweats, chills.    Objective:   Physical Exam  Vital signs are reviewed GEN.: Well-developed male no acute distress CHEST: Symmetrical. Nontender to palpation. He shows me a small area that we examined previously in the left medial pectoral muscle, it's about 4-5 mm in diameter and mobile. Subcutaneous. Nontender. LUNGS: Clear to auscultation bilaterally without any rales or wheeze, he has normal air movement in all lung fields. BACK: Scapular motion is normal EXTREMITY: Upper extremity strength 5 out of 5 in all planes the rotator cuff NECK: Full range of motion no lymphadenopathy no thyromegaly.      Assessment & Plan:  Patient complaint of abnormal breathing pattern. He thinks this is either related to his reflux or that he has some inherent lung problem. He is Artie main appointment with the pulmonologist. There may be some contribution of reflux of his cough so I think getting further  evaluation for his reflux and his reported hiatal hernia is beneficial. I don't think he needs to see a pulmonologist. I think his "breathing irregularity" is part anxiety in part deconditioning. I discussed at length. He is reassured.

## 2015-09-18 ENCOUNTER — Ambulatory Visit (INDEPENDENT_AMBULATORY_CARE_PROVIDER_SITE_OTHER)
Admission: RE | Admit: 2015-09-18 | Discharge: 2015-09-18 | Disposition: A | Discharge status: Home / Self Care | Payer: 59 | Source: Ambulatory Visit | Attending: Internal Medicine | Admitting: Internal Medicine

## 2015-09-18 ENCOUNTER — Encounter: Payer: Self-pay | Admitting: Internal Medicine

## 2015-09-18 ENCOUNTER — Telehealth: Payer: Self-pay | Admitting: Internal Medicine

## 2015-09-18 ENCOUNTER — Ambulatory Visit (INDEPENDENT_AMBULATORY_CARE_PROVIDER_SITE_OTHER): Payer: 59 | Admitting: Internal Medicine

## 2015-09-18 VITALS — BP 140/80 | HR 85 | Ht 69.0 in | Wt 188.2 lb

## 2015-09-18 DIAGNOSIS — R05 Cough: Secondary | ICD-10-CM

## 2015-09-18 DIAGNOSIS — K219 Gastro-esophageal reflux disease without esophagitis: Secondary | ICD-10-CM | POA: Diagnosis not present

## 2015-09-18 DIAGNOSIS — R058 Other specified cough: Secondary | ICD-10-CM

## 2015-09-18 MED ORDER — PANTOPRAZOLE SODIUM 40 MG PO TBEC: 40.0000 mg | DELAYED_RELEASE_TABLET | Freq: Every day | ORAL | Status: DC

## 2015-09-18 MED ORDER — FAMOTIDINE 20 MG PO TABS: ORAL_TABLET | ORAL | Status: DC

## 2015-09-18 NOTE — Assessment & Plan Note (Signed)
This is almost certainly not asthma but  Classic Upper airway cough syndrome, so named because it's frequently impossible to sort out how much is  CR/sinusitis with freq throat clearing (which can be related to primary GERD)   vs  causing  secondary (" extra esophageal")  GERD from wide swings in gastric pressure that occur with throat clearing, often  promoting self use of mint and menthol lozenges that reduce the lower esophageal sphincter tone and exacerbate the problem further in a cyclical fashion.   These are the same pts (now being labeled as having "irritable larynx syndrome" by some cough centers) who not infrequently have a history of having failed to tolerate ace inhibitors,  dry powder inhalers or biphosphonates or report having atypical reflux symptoms that don't respond to standard doses of PPI , and are easily confused as having aecopd or asthma flares by even experienced allergists/ pulmonologists.   Based on his noct choking likely all related to GERD with atypical symptoms and would benefit most from max gerd rx and ? Manometry ? Candidate for NF at such a young age.  If not better next step would be methacholine challenge  I had an extended discussion with the patient reviewing all relevant studies completed to date and  lasting 35  minutes of a 60 minute visit    Each maintenance medication was reviewed in detail including most importantly the difference between maintenance and prns and under what circumstances the prns are to be triggered using an action plan format that is not reflected in the computer generated alphabetically organized AVS.    Please see instructions for details which were reviewed in writing and the patient given a copy highlighting the part that I personally wrote and discussed at today's ov.

## 2015-09-18 NOTE — Assessment & Plan Note (Signed)
Dg Es 09/04/13 Small to moderate-sized hiatal hernia with inducible GE reflux. - referred to GI 09/18/2015 >>

## 2015-09-18 NOTE — Progress Notes (Signed)
Quick Note:  Spoke with pt and notified of results per Dr. Wert. Pt verbalized understanding and denied any questions.  ______ 

## 2015-09-18 NOTE — Progress Notes (Signed)
Subjective:    Patient ID: Jimmy Orozco, male    DOB: 1980/12/13,   MRN: [id number]  HPI  34 yobm smoked in college only, good ex tolerance including running up to 2 miles but stopped doing x 05/2015 and started gaining wt and then onset of sob around first of October 2016 assoc with dry cough eval by Dr Nehemiah Settle rec try saba and only tried   x one wasn't sure it helped so self referred to pulmonary clinic 09/18/2015    09/18/2015 1st [facility] office visit/ Coleman Kalas   Chief Complaint  Patient presents with  . Pulmonary Consult    Self referral. Pt c/o SOB and cough for the past 3-4 wks. He states he feels as if he is unable to take a good, deep breath. His cough is non prod.   sometimes happens at rest but never sleeping but always is propped up on 2 pillows though has had occ choking sensation, always happens with heavy exertion like steps, but could jog fine again as recently as one week prior to OV  Assoc dry cough and voice change noted/ already on ppi daily otc  No  obvious patterns in day to day or daytime variabilty or assoc  cp or chest tightness, subjective wheeze . No unusual exp hx or h/o childhood pna/ asthma or knowledge of premature birth.  Sleeping ok most the time without nocturnal  or early am exacerbation  of respiratory  c/o's or need for noct saba. Also denies any obvious fluctuation of symptoms with weather or environmental changes or other aggravating or alleviating factors except as outlined above   Current Medications, Allergies, Complete Past Medical History, Past Surgical History, Family History, and Social History were reviewed in [employer] record.                Review of Systems  Constitutional: Negative for fever, chills, activity change, appetite change and unexpected weight change.  HENT: Positive for congestion. Negative for dental problem, postnasal drip, rhinorrhea, sneezing, sore throat, trouble swallowing and voice  change.   Eyes: Negative for visual disturbance.  Respiratory: Positive for cough and shortness of breath. Negative for choking.   Cardiovascular: Negative for chest pain and leg swelling.  Gastrointestinal: Negative for nausea, vomiting and abdominal pain.  Genitourinary: Negative for difficulty urinating.       Indigestion  Musculoskeletal: Negative for arthralgias.  Skin: Negative for rash.  Psychiatric/Behavioral: Negative for behavioral problems and confusion.       Objective:   Physical Exam  amb bm nad/ classic voice fatigue but no pseudowheeze noted   Wt Readings from Last 3 Encounters:  09/18/15 188 lb 3.2 oz (85.367 kg)  09/15/15 190 lb (86.183 kg)  01/28/15 185 lb (83.915 kg)    Vital signs reviewed  HEENT: nl dentition, turbinates, and orophanx. Nl external ear canals without cough reflex   NECK :  without JVD/Nodes/TM/ nl carotid upstrokes bilaterally   LUNGS: no acc muscle use, clear to A and P bilaterally without cough on insp or exp maneuvers   CV:  RRR  no s3 or murmur or increase in P2, no edema   ABD:  soft and nontender with nl excursion in the supine position. No bruits or organomegaly, bowel sounds nl  MS:  warm without deformities, calf tenderness, cyanosis or clubbing  SKIN: warm and dry without lesions    NEURO:  alert, approp, no deficits     Dg Es 09/04/13 Small  to moderate-sized hiatal hernia with inducible GE reflux.  CXR PA and Lateral:   09/18/2015 :    I personally reviewed images and agree with radiology impression as follows:   No edema or consolidation.          Assessment & Plan:

## 2015-09-18 NOTE — Telephone Encounter (Signed)
Pt called back and i show his meds were called to pharmacy pt aware

## 2015-09-18 NOTE — Addendum Note (Signed)
Addended by: Sandrea HughsWERT, Neomi Laidler B on: 09/18/2015 04:39 PM   Modules accepted: Orders

## 2015-09-18 NOTE — Patient Instructions (Addendum)
Pantoprazole (protonix) 40 mg  Take  30-60 min before first meal of the day and Pepcid (famotidine)  20 mg one @  bedtime until return to office - this is the best way to tell whether stomach acid is contributing to your problem.    GERD (REFLUX)  is an extremely common cause of respiratory symptoms just like yours , many times with no obvious heartburn at all.    It can be treated with medication, but also with lifestyle changes including elevation of the head of your bed (ideally with 6 inch  bed blocks),  Smoking cessation, avoidance of late meals, excessive alcohol, and avoid fatty foods, chocolate, peppermint, colas, red wine, and acidic juices such as orange juice.  NO MINT OR MENTHOL PRODUCTS SO NO COUGH DROPS  USE SUGARLESS CANDY INSTEAD (Jolley ranchers or Stover's or Life Savers) or even ice chips will also do - the key is to swallow to prevent all throat clearing. NO OIL BASED VITAMINS - use powdered substitutes.   Please see patient coordinator before you leave today  to schedule a GI eval by [facility]... (Dr Shirlee MoreMannam's wife)   Please remember to go to the  x-ray department downstairs for your tests - we will call you with the results when they are available.

## 2015-09-19 ENCOUNTER — Ambulatory Visit (INDEPENDENT_AMBULATORY_CARE_PROVIDER_SITE_OTHER): Payer: 59 | Admitting: Gastroenterology

## 2015-09-19 ENCOUNTER — Encounter: Payer: Self-pay | Admitting: Gastroenterology

## 2015-09-19 VITALS — BP 128/82 | HR 76 | Ht 68.5 in | Wt 186.0 lb

## 2015-09-19 DIAGNOSIS — K449 Diaphragmatic hernia without obstruction or gangrene: Secondary | ICD-10-CM

## 2015-09-19 DIAGNOSIS — R05 Cough: Secondary | ICD-10-CM | POA: Diagnosis not present

## 2015-09-19 DIAGNOSIS — K219 Gastro-esophageal reflux disease without esophagitis: Secondary | ICD-10-CM

## 2015-09-19 DIAGNOSIS — R059 Cough, unspecified: Secondary | ICD-10-CM

## 2015-09-19 NOTE — Patient Instructions (Addendum)
We have scheduled [facility] for your End[facility] 10[facility]2015 at 12:30pm arrive at 11:00am  Separate instructions have been given Hold Pepcid and protonix 7 days before your test

## 2015-09-19 NOTE — Addendum Note (Signed)
Addended by: Marlowe KaysSTALLINGS, Geneal Huebert M on: 09/19/2015 03:19 PM   Modules accepted: Orders

## 2015-09-19 NOTE — Progress Notes (Signed)
Jimmy Orozco    [id number]    03-Jan-1981  Primary Care Physician:POLITE,RONALD D, MD  Referring Physician: Renford Dills, MD [address] [place], [place] [address]  Chief complaint:  Cough  HPI:  34 year old African-American male here for new patient visit with complaints of cough and sensation of choking which wakes him up middle of the night, he also reported difficulty breathing. He denies heartburn, regurgitation, acid taste or hoarseness of voice. He saw Dr. Sherene Sires in pulmonary and was started on Protonix daily and also recommended anti reflux measures. He underwent barium esophagram in 2014 which showed a small hiatal hernia and inducible gastroesophageal reflux. He has gained about 25-30 pounds within the past 1 year. He is a nonsmoker but is exposed to secondhand smoking. Denies dysphagia nausea or vomiting. He started Protonix yesterday. He feels slightly better since he stopped eating dinner late and is sleeping with his head elevated.   Outpatient Encounter Prescriptions as of 09/19/2015  Medication Sig  . cetirizine-pseudoephedrine (ZYRTEC-D) 5-120 MG tablet Take 1 tablet by mouth 2 (two) times daily.  . famotidine (PEPCID) 20 MG tablet One at bedtime  . fluticasone (FLONASE) 50 MCG/ACT nasal spray Place 2 sprays into both nostrils daily.  . pantoprazole (PROTONIX) 40 MG tablet Take 1 tablet (40 mg total) by mouth daily. Take 30-60 min before first meal of the day  . PROVENTIL HFA 108 (90 BASE) MCG/ACT inhaler inhale 2 puffs by mouth 4 hours   No facility-administered encounter medications on file as of 09/19/2015.    Allergies as of 09/19/2015 - Review Complete 09/19/2015  Allergen Reaction Noted  . Iodine  04/21/2012    Past Medical History  Diagnosis Date  . Allergy   . GERD (gastroesophageal reflux disease)   . Hiatal hernia     Past Surgical History  Procedure Laterality Date  . Ear cyst excision Left     Family History    Problem Relation Age of Onset  . Diabetes Mother   . Hypertension Mother   . Hypertension Father   . Asthma Paternal Grandmother   . Hypertension Paternal Grandmother   . Hypertension Paternal Grandfather   . Hypertension Maternal Grandfather     Social History   Social History  . Marital Status: Married    Spouse Name: N/A  . Number of Children: 1  . Years of Education: N/A   Occupational History  . Furniture conservator/restorer    Social History Main Topics  . Smoking status: Passive Smoke Exposure - Never Smoker  . Smokeless tobacco: Never Used  . Alcohol Use: No  . Drug Use: No  . Sexual Activity: Not on file   Other Topics Concern  . Not on file   Social History Narrative      Review of systems: Review of Systems  Constitutional: Negative for fever and chills.  HENT: Negative.   Eyes: Negative for blurred vision.  Respiratory: Negative for cough, shortness of breath and wheezing.   Cardiovascular: Negative for chest pain and palpitations.  Gastrointestinal: as per HPI Genitourinary: Negative for dysuria, urgency, frequency and hematuria.  Musculoskeletal: Negative for myalgias, back pain and joint pain.  Skin: Negative for itching and rash.  Neurological: Negative for dizziness, tremors, focal weakness, seizures and loss of consciousness.  Endo/Heme/Allergies: Negative for environmental allergies.  Psychiatric/Behavioral: Negative for depression, suicidal ideas and hallucinations.  All other systems reviewed and are negative.   Physical Exam: Ceasar Mons  Vitals:   09/19/15 1419  BP: 128/82  Pulse: 76   Gen:      No acute distress HEENT:  EOMI, sclera anicteric Neck:     No masses; no thyromegaly Lungs:    Clear to auscultation bilaterally; normal respiratory effort CV:         Regular rate and rhythm; no murmurs Abd:      + bowel sounds; soft, non-tender; no palpable masses, no distension Ext:    No edema; adequate peripheral perfusion Skin:      Warm and dry;  no rash Neuro: alert and oriented x 3 Psych: normal mood and affect  Data Reviewed: Esophagogram 2014 Initial barium swallows demonstrate normal pharyngeal motion with swallowing. No laryngeal penetration or aspiration. No upper esophageal webs, strictures or diverticuli. The piriform sinus and vallecular air spaces are normal.  Normal esophageal motility. No intrinsic or extrinsic lesions of the esophagus were identified and no mucosal abnormalities were seen. There is a small to moderate-sized sliding-type hiatal hernia. Episodes of inducible GE reflux were demonstrated.  A 13 mm barium pill passed into the stomach without difficulty.  IMPRESSION: Small to moderate-sized hiatal hernia with inducible GE reflux.  The examination is otherwise normal.   Assessment and Plan/Recommendations:  34 year old African-American male here with complaints of cough and intermittent choking sensation which wakes him up middle of the night which could be atypical symptoms of gastroesophageal reflux Reinforced the importance of following  antireflux measures: No meals 3 hours before bedtime and he needs to sleep with head elevation.  Continue protonix 40 mg daily We will schedule for esophageal manometry and EGD with 48 hour pH wireless bravo study. He will be off PPIs 7 days prior to the test Return after the procedures  K. Scherry RanVeena Nandigam , MD [phone] Mon-Fri 8a-5p [phone] after 5p, weekends, holidays

## 2015-09-25 ENCOUNTER — Encounter (HOSPITAL_COMMUNITY): Payer: Self-pay | Admitting: *Deleted

## 2015-10-06 ENCOUNTER — Encounter (HOSPITAL_COMMUNITY)
Admission: RE | Disposition: A | Discharge status: Home / Self Care | Payer: Self-pay | Source: Ambulatory Visit | Attending: Gastroenterology

## 2015-10-06 ENCOUNTER — Ambulatory Visit (HOSPITAL_COMMUNITY): Admit: 2015-10-06 | Payer: Self-pay

## 2015-10-06 ENCOUNTER — Ambulatory Visit (HOSPITAL_COMMUNITY): Payer: 59 | Admitting: Anesthesiology

## 2015-10-06 ENCOUNTER — Encounter (HOSPITAL_COMMUNITY): Payer: Self-pay

## 2015-10-06 ENCOUNTER — Encounter (HOSPITAL_COMMUNITY): Payer: Self-pay | Admitting: *Deleted

## 2015-10-06 ENCOUNTER — Ambulatory Visit (HOSPITAL_COMMUNITY)
Admission: RE | Admit: 2015-10-06 | Discharge: 2015-10-06 | Disposition: A | Discharge status: Home / Self Care | Payer: 59 | Source: Ambulatory Visit | Attending: Gastroenterology | Admitting: Gastroenterology

## 2015-10-06 DIAGNOSIS — G709 Myoneural disorder, unspecified: Secondary | ICD-10-CM | POA: Diagnosis not present

## 2015-10-06 DIAGNOSIS — Z79899 Other long term (current) drug therapy: Secondary | ICD-10-CM | POA: Diagnosis not present

## 2015-10-06 DIAGNOSIS — K449 Diaphragmatic hernia without obstruction or gangrene: Secondary | ICD-10-CM | POA: Diagnosis not present

## 2015-10-06 DIAGNOSIS — R059 Cough, unspecified: Secondary | ICD-10-CM

## 2015-10-06 DIAGNOSIS — R053 Chronic cough: Secondary | ICD-10-CM | POA: Insufficient documentation

## 2015-10-06 DIAGNOSIS — R05 Cough: Secondary | ICD-10-CM | POA: Diagnosis not present

## 2015-10-06 DIAGNOSIS — K219 Gastro-esophageal reflux disease without esophagitis: Secondary | ICD-10-CM | POA: Diagnosis not present

## 2015-10-06 DIAGNOSIS — K2980 Duodenitis without bleeding: Secondary | ICD-10-CM | POA: Insufficient documentation

## 2015-10-06 DIAGNOSIS — K208 Other esophagitis: Secondary | ICD-10-CM | POA: Diagnosis not present

## 2015-10-06 DIAGNOSIS — Z7722 Contact with and (suspected) exposure to environmental tobacco smoke (acute) (chronic): Secondary | ICD-10-CM | POA: Insufficient documentation

## 2015-10-06 DIAGNOSIS — Z7951 Long term (current) use of inhaled steroids: Secondary | ICD-10-CM | POA: Diagnosis not present

## 2015-10-06 HISTORY — PX: BRAVO PH STUDY: SHX5421

## 2015-10-06 HISTORY — PX: ESOPHAGOGASTRODUODENOSCOPY (EGD) WITH PROPOFOL: SHX5813

## 2015-10-06 HISTORY — PX: ESOPHAGEAL MANOMETRY: SHX5429

## 2015-10-06 SURGERY — ESOPHAGOGASTRODUODENOSCOPY (EGD) WITH PROPOFOL
Anesthesia: Monitor Anesthesia Care

## 2015-10-06 SURGERY — MANOMETRY, ESOPHAGUS

## 2015-10-06 SURGERY — MANOMETRY, ESOPHAGUS
Anesthesia: Monitor Anesthesia Care

## 2015-10-06 MED ORDER — EPHEDRINE SULFATE 50 MG/ML IJ SOLN
INTRAMUSCULAR | Status: AC
  Filled 2015-10-06: qty 1

## 2015-10-06 MED ORDER — LIDOCAINE VISCOUS 2 % MT SOLN
OROMUCOSAL | Status: AC
Start: 2015-10-06 — End: 2015-10-06
  Filled 2015-10-06: qty 15

## 2015-10-06 MED ORDER — SODIUM CHLORIDE 0.9 % IV SOLN: INTRAVENOUS | Status: DC

## 2015-10-06 MED ORDER — PROPOFOL 500 MG/50ML IV EMUL
INTRAVENOUS | Status: DC | PRN
  Administered 2015-10-06: 200 ug/kg/min via INTRAVENOUS

## 2015-10-06 MED ORDER — PROPOFOL 10 MG/ML IV BOLUS
INTRAVENOUS | Status: AC
  Filled 2015-10-06: qty 20

## 2015-10-06 MED ORDER — LACTATED RINGERS IV SOLN
INTRAVENOUS | Status: DC
  Administered 2015-10-06: 13:00:00 via INTRAVENOUS
  Administered 2015-10-06: 1000 mL via INTRAVENOUS

## 2015-10-06 MED ORDER — SODIUM CHLORIDE 0.9 % IJ SOLN
INTRAMUSCULAR | Status: AC
  Filled 2015-10-06: qty 10

## 2015-10-06 MED ORDER — ONDANSETRON HCL 4 MG/2ML IJ SOLN
INTRAMUSCULAR | Status: DC | PRN
  Administered 2015-10-06: 4 mg via INTRAVENOUS

## 2015-10-06 MED ORDER — ONDANSETRON HCL 4 MG/2ML IJ SOLN
INTRAMUSCULAR | Status: AC
  Filled 2015-10-06: qty 2

## 2015-10-06 MED ORDER — PROPOFOL 500 MG/50ML IV EMUL
INTRAVENOUS | Status: DC | PRN
  Administered 2015-10-06: 10 mg via INTRAVENOUS
  Administered 2015-10-06: 70 mg via INTRAVENOUS

## 2015-10-06 SURGICAL SUPPLY — 14 items

## 2015-10-06 SURGICAL SUPPLY — 2 items
FACESHIELD LNG OPTICON STERILE (SAFETY) IMPLANT
GLOVE BIO SURGEON STRL SZ8 (GLOVE) ×4 IMPLANT

## 2015-10-06 NOTE — Interval H&P Note (Signed)
History and Physical Interval Note:  10/06/2015 12:48 PM  Jimmy Orozco  has presented today for surgery, with the diagnosis of GERD  The various methods of treatment have been discussed with the patient and family. After consideration of risks, benefits and other options for treatment, the patient has consented to  Procedure(s): ESOPHAGOGASTRODUODENOSCOPY (EGD) WITH PROPOFOL (N/A) BRAVO PH STUDY (N/A) as a surgical intervention .  The patient's history has been reviewed, patient examined, no change in status, stable for surgery.  I have reviewed the patient's chart and labs.  Questions were answered to the patient's satisfaction.    Iona BeardK. Veena Paulla Mcclaskey , MD [phone] Mon-Fri 8a-5p [phone] after 5p, weekends, holidays

## 2015-10-06 NOTE — H&P (View-Only) (Signed)
         Jimmy Orozco    [id number]    10/30/1981  Primary Care Physician:POLITE,RONALD D, MD  Referring Physician: Ronald Polite, MD [address] , [place] [address]  Chief complaint:  Cough  HPI:  34-year-old African-American male here for new patient visit with complaints of cough and sensation of choking which wakes him up middle of the night, he also reported difficulty breathing. He denies heartburn, regurgitation, acid taste or hoarseness of voice. He saw Dr. Wert in pulmonary and was started on Protonix daily and also recommended anti reflux measures. He underwent barium esophagram in 2014 which showed a small hiatal hernia and inducible gastroesophageal reflux. He has gained about 25-30 pounds within the past 1 year. He is a nonsmoker but is exposed to secondhand smoking. Denies dysphagia nausea or vomiting. He started Protonix yesterday. He feels slightly better since he stopped eating dinner late and is sleeping with his head elevated.   Outpatient Encounter Prescriptions as of 09/19/2015  Medication Sig  . cetirizine-pseudoephedrine (ZYRTEC-D) 5-120 MG tablet Take 1 tablet by mouth 2 (two) times daily.  . famotidine (PEPCID) 20 MG tablet One at bedtime  . fluticasone (FLONASE) 50 MCG/ACT nasal spray Place 2 sprays into both nostrils daily.  . pantoprazole (PROTONIX) 40 MG tablet Take 1 tablet (40 mg total) by mouth daily. Take 30-60 min before first meal of the day  . PROVENTIL HFA 108 (90 BASE) MCG/ACT inhaler inhale 2 puffs by mouth 4 hours   No facility-administered encounter medications on file as of 09/19/2015.    Allergies as of 09/19/2015 - Review Complete 09/19/2015  Allergen Reaction Noted  . Iodine  04/21/2012    Past Medical History  Diagnosis Date  . Allergy   . GERD (gastroesophageal reflux disease)   . Hiatal hernia     Past Surgical History  Procedure Laterality Date  . Ear cyst excision Left     Family History    Problem Relation Age of Onset  . Diabetes Mother   . Hypertension Mother   . Hypertension Father   . Asthma Paternal Grandmother   . Hypertension Paternal Grandmother   . Hypertension Paternal Grandfather   . Hypertension Maternal Grandfather     Social History   Social History  . Marital Status: Married    Spouse Name: N/A  . Number of Children: 1  . Years of Education: N/A   Occupational History  . Production Manager    Social History Main Topics  . Smoking status: Passive Smoke Exposure - Never Smoker  . Smokeless tobacco: Never Used  . Alcohol Use: No  . Drug Use: No  . Sexual Activity: Not on file   Other Topics Concern  . Not on file   Social History Narrative      Review of systems: Review of Systems  Constitutional: Negative for fever and chills.  HENT: Negative.   Eyes: Negative for blurred vision.  Respiratory: Negative for cough, shortness of breath and wheezing.   Cardiovascular: Negative for chest pain and palpitations.  Gastrointestinal: as per HPI Genitourinary: Negative for dysuria, urgency, frequency and hematuria.  Musculoskeletal: Negative for myalgias, back pain and joint pain.  Skin: Negative for itching and rash.  Neurological: Negative for dizziness, tremors, focal weakness, seizures and loss of consciousness.  Endo/Heme/Allergies: Negative for environmental allergies.  Psychiatric/Behavioral: Negative for depression, suicidal ideas and hallucinations.  All other systems reviewed and are negative.   Physical Exam: Filed   Vitals:   09/19/15 1419  BP: 128/82  Pulse: 76   Gen:      No acute distress HEENT:  EOMI, sclera anicteric Neck:     No masses; no thyromegaly Lungs:    Clear to auscultation bilaterally; normal respiratory effort CV:         Regular rate and rhythm; no murmurs Abd:      + bowel sounds; soft, non-tender; no palpable masses, no distension Ext:    No edema; adequate peripheral perfusion Skin:      Warm and dry;  no rash Neuro: alert and oriented x 3 Psych: normal mood and affect  Data Reviewed: Esophagogram 2014 Initial barium swallows demonstrate normal pharyngeal motion with swallowing. No laryngeal penetration or aspiration. No upper esophageal webs, strictures or diverticuli. The piriform sinus and vallecular air spaces are normal.  Normal esophageal motility. No intrinsic or extrinsic lesions of the esophagus were identified and no mucosal abnormalities were seen. There is a small to moderate-sized sliding-type hiatal hernia. Episodes of inducible GE reflux were demonstrated.  A 13 mm barium pill passed into the stomach without difficulty.  IMPRESSION: Small to moderate-sized hiatal hernia with inducible GE reflux.  The examination is otherwise normal.   Assessment and Plan/Recommendations:  34 year old African-American male here with complaints of cough and intermittent choking sensation which wakes him up middle of the night which could be atypical symptoms of gastroesophageal reflux Reinforced the importance of following  antireflux measures: No meals 3 hours before bedtime and he needs to sleep with head elevation.  Continue protonix 40 mg daily We will schedule for esophageal manometry and EGD with 48 hour pH wireless bravo study. He will be off PPIs 7 days prior to the test Return after the procedures  K. Scherry RanVeena Nandigam , MD [phone] Mon-Fri 8a-5p [phone] after 5p, weekends, holidays

## 2015-10-06 NOTE — Op Note (Signed)
[facility] [address] [place], [id number]   ENDOSCOPY PROCEDURE REPORT  PATIENT: Jimmy Orozco, Jimmy Orozco  MR#: [id number] BIRTHDATE: December 10, 1980 , 34  yrs. old GENDER: male ENDOSCOPIST: Marsa ArisKavitha Meesha Sek, MD REFERRED BY:  Nyoka CowdenMichael B Wert, M.D. PROCEDURE DATE:  10/06/2015 PROCEDURE:  EGD w/ biopsy ASA CLASS:     Class II INDICATIONS:  history of esophageal reflux. MEDICATIONS: Monitored anesthesia care TOPICAL ANESTHETIC: none  DESCRIPTION OF PROCEDURE: After the risks benefits and alternatives of the procedure were thoroughly explained, informed consent was obtained.  The Pentax Gastroscope [id number] endoscope was introduced through the mouth and advanced to the second portion of the duodenum , Without limitations.  The instrument was slowly withdrawn as the mucosa was fully examined.    LA grade B erosive esophagitis, irregular Z line.  Squamocolumnar junction at 43 cm from incisors.  Random biopsies were obtained at irregular Z line.  small sliding hiatal hernia. Gastric mucosa appeared normal.  Evidence of mild duodenitis in the duodenal bulb and second part of duodenum appeared normal 48-hour pH wireless bravo sensor placed as per standard protocol and photo documented.  Retroflexed views revealed as previously described.     The scope was then withdrawn from the patient and the procedure completed.  COMPLICATIONS: There were no immediate complications.  ENDOSCOPIC IMPRESSION: LA grade B erosive esophagitis, irregular Z line.  Squamocolumnar junction at 43 cm from incisors.  Random biopsies were obtained at irregular Z line.  small sliding hiatal hernia. Gastric mucosa appeared normal.  Evidence of mild duodenitis in the duodenal bulb and second part of duodenum appeared normal 48-hour pH wireless bravo sensor placed as per standard protocol and photo documented  RECOMMENDATIONS: 1.  Await BRAVO results 2.  Await pathology results  REPEAT  EXAM:  eSigned:  Marsa ArisKavitha Malvika Tung, MD 10/06/2015 2:03 PM

## 2015-10-06 NOTE — OR Nursing (Signed)
[facility] was 8.1.  Sip of coke given, ph 6.9 after coke.  PH recovered to 8.1.    Omelia BlackwaterShelby Robyne Matar, RN

## 2015-10-06 NOTE — Transfer of Care (Signed)
Immediate Anesthesia Transfer of Care Note  Patient: Jimmy DownsWilliam J Orozco  Procedure(s) Performed: Procedure(s): ESOPHAGOGASTRODUODENOSCOPY (EGD) WITH PROPOFOL (N/A) BRAVO PH STUDY (N/A)  Patient Location: PACU  Anesthesia Type:MAC  Level of Consciousness:  sedated, patient cooperative and responds to stimulation  Airway & Oxygen Therapy:Patient Spontanous Breathing and Patient connected to face mask oxgen  Post-op Assessment:  Report given to PACU RN and Post -op Vital signs reviewed and stable  Post vital signs:  Reviewed and stable  Last Vitals:  Filed Vitals:   10/06/15 1224  BP: 173/103  Pulse: 83  Temp: 36.7 C  Resp: 12    Complications: No apparent anesthesia complications

## 2015-10-06 NOTE — Anesthesia Preprocedure Evaluation (Signed)
Anesthesia Evaluation  Patient identified by MRN, date of birth, ID band Patient awake    Reviewed: Allergy & Precautions, NPO status , Patient's Chart, lab work & pertinent test results  Airway Mallampati: II   Neck ROM: full    Dental   Pulmonary neg pulmonary ROS,    breath sounds clear to auscultation       Cardiovascular negative cardio ROS   Rhythm:regular Rate:Normal     Neuro/Psych  Neuromuscular disease    GI/Hepatic hiatal hernia, GERD  ,  Endo/Other    Renal/GU      Musculoskeletal   Abdominal   Peds  Hematology   Anesthesia Other Findings   Reproductive/Obstetrics                             Anesthesia Physical Anesthesia Plan  ASA: II  Anesthesia Plan: MAC   Post-op Pain Management:    Induction: Intravenous  Airway Management Planned: Simple Face Mask  Additional Equipment:   Intra-op Plan:   Post-operative Plan:   Informed Consent: I have reviewed the patients History and Physical, chart, labs and discussed the procedure including the risks, benefits and alternatives for the proposed anesthesia with the patient or authorized representative who has indicated his/her understanding and acceptance.     Plan Discussed with: CRNA, Anesthesiologist and Surgeon  Anesthesia Plan Comments:         Anesthesia Quick Evaluation

## 2015-10-06 NOTE — Discharge Instructions (Signed)

## 2015-10-07 ENCOUNTER — Encounter (HOSPITAL_COMMUNITY): Payer: Self-pay | Admitting: Gastroenterology

## 2015-10-07 NOTE — Anesthesia Postprocedure Evaluation (Signed)
Anesthesia Post Note  Patient: Jimmy DownsWilliam J Orozco  Procedure(s) Performed: Procedure(s) (LRB): ESOPHAGOGASTRODUODENOSCOPY (EGD) WITH PROPOFOL (N/A) BRAVO PH STUDY (N/A)  Anesthesia type: MAC  Patient location: PACU  Post pain: Pain level controlled and Adequate analgesia  Post assessment: Post-op Vital signs reviewed, Patient's Cardiovascular Status Stable and Respiratory Function Stable  Last Vitals:  Filed Vitals:   10/06/15 1410  BP: 150/93  Pulse: 77  Temp:   Resp: 20    Post vital signs: Reviewed and stable  Level of consciousness: awake, alert  and oriented  Complications: No apparent anesthesia complications

## 2015-10-08 ENCOUNTER — Telehealth: Payer: Self-pay | Admitting: Gastroenterology

## 2015-10-09 NOTE — Telephone Encounter (Signed)
Spoke with the patient. Per Dr Elana AlmNandigam's procedure note, the patient is to resume Protonix 40 mg daily. The other medication is Famotidine prescribed by another doctor and he was taking it at bedtime. No mention of this in her discharge note. Advised patient the PH study may take a week to interpret the results. He is changing jobs and moving away on 10/18/15. He will stay in touch for his results and follow up.

## 2015-10-23 ENCOUNTER — Encounter: Payer: Self-pay | Admitting: Gastroenterology

## 2015-12-18 DIAGNOSIS — K219 Gastro-esophageal reflux disease without esophagitis: Secondary | ICD-10-CM | POA: Insufficient documentation

## 2016-02-22 IMAGING — DX DG CHEST 2V
2 series · 2 of 2 positions shown · non-contrast
Comparison: August 21, 2013

CLINICAL DATA: Shortness of breath with exertion and cough

EXAM:
CHEST  2 VIEW

[chest pa]
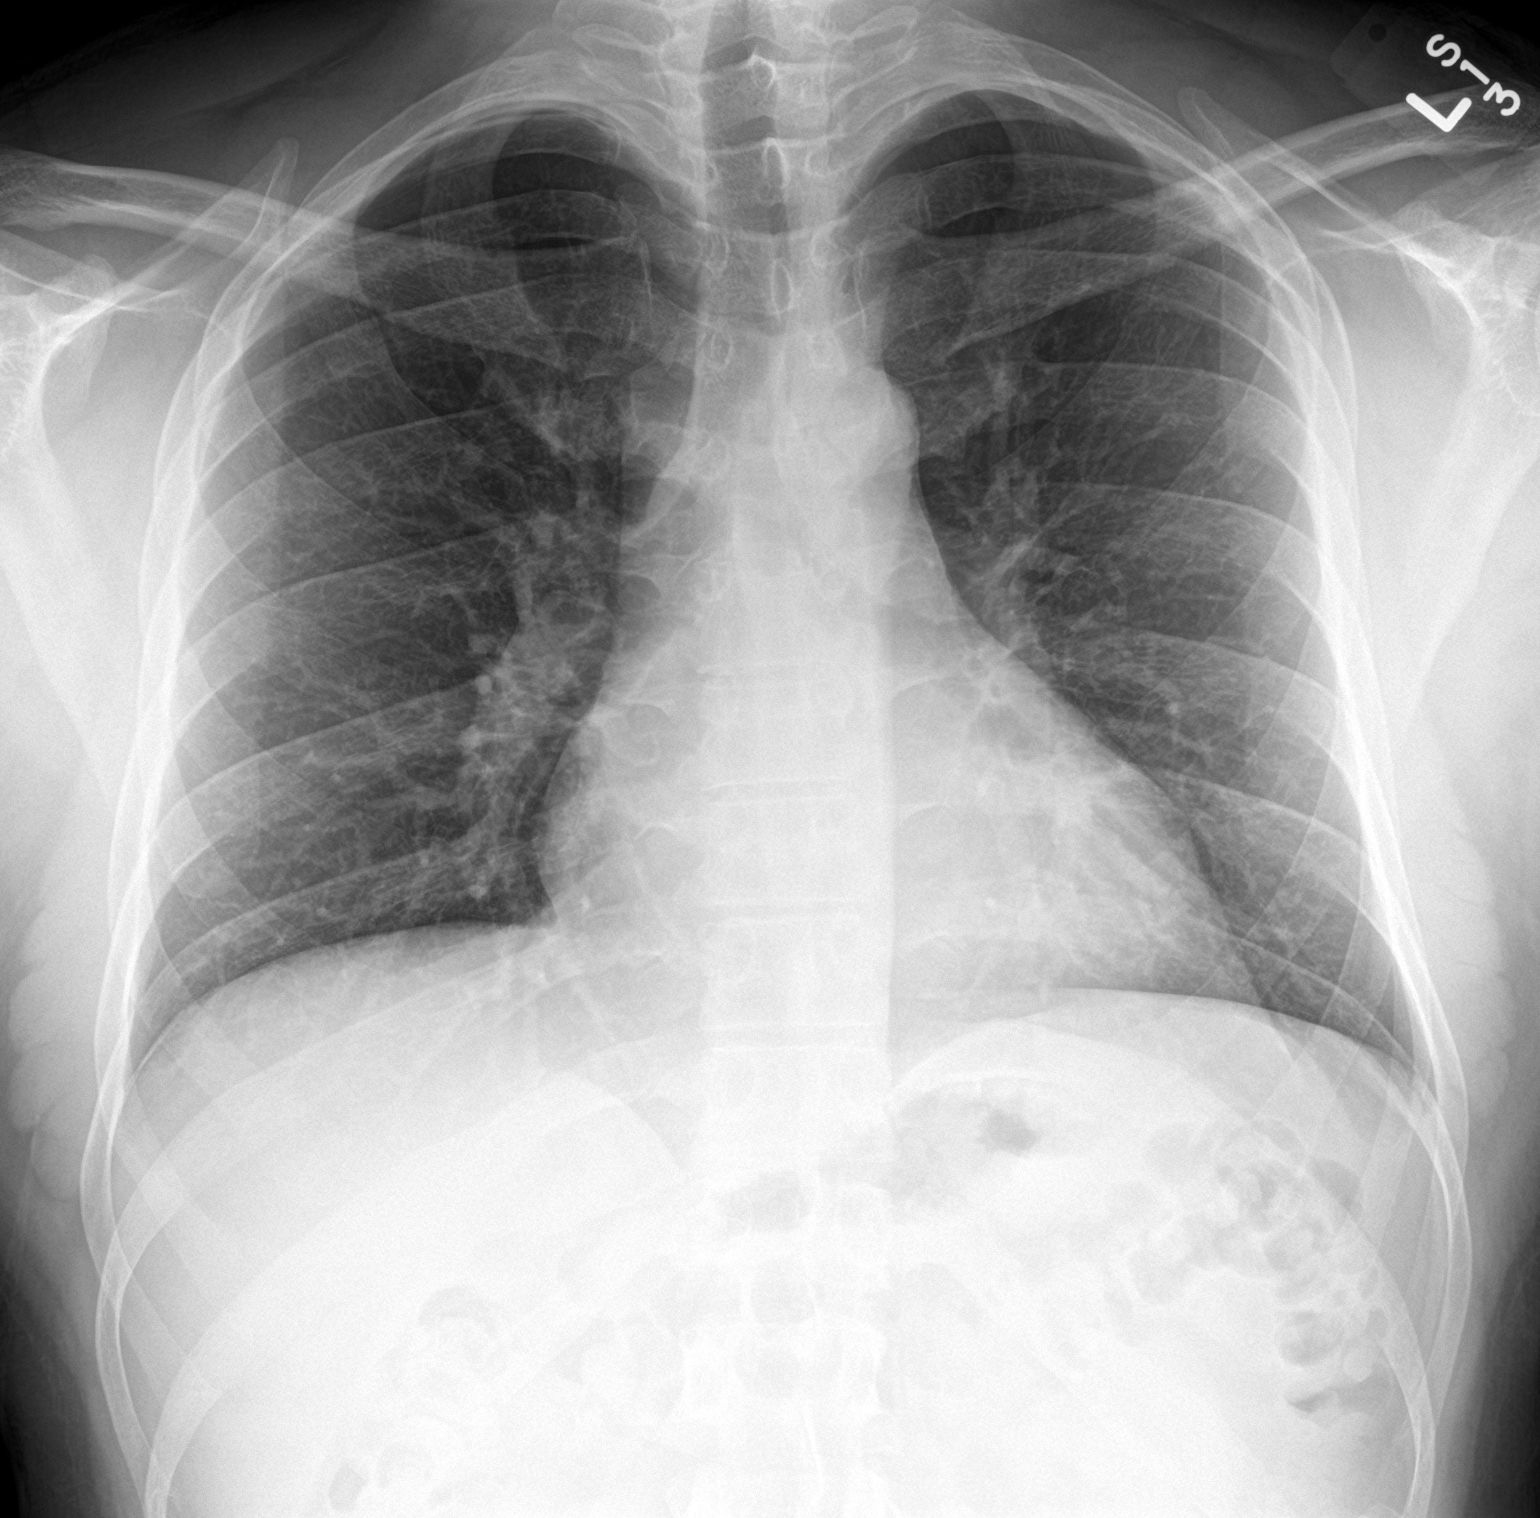

[chest lat]
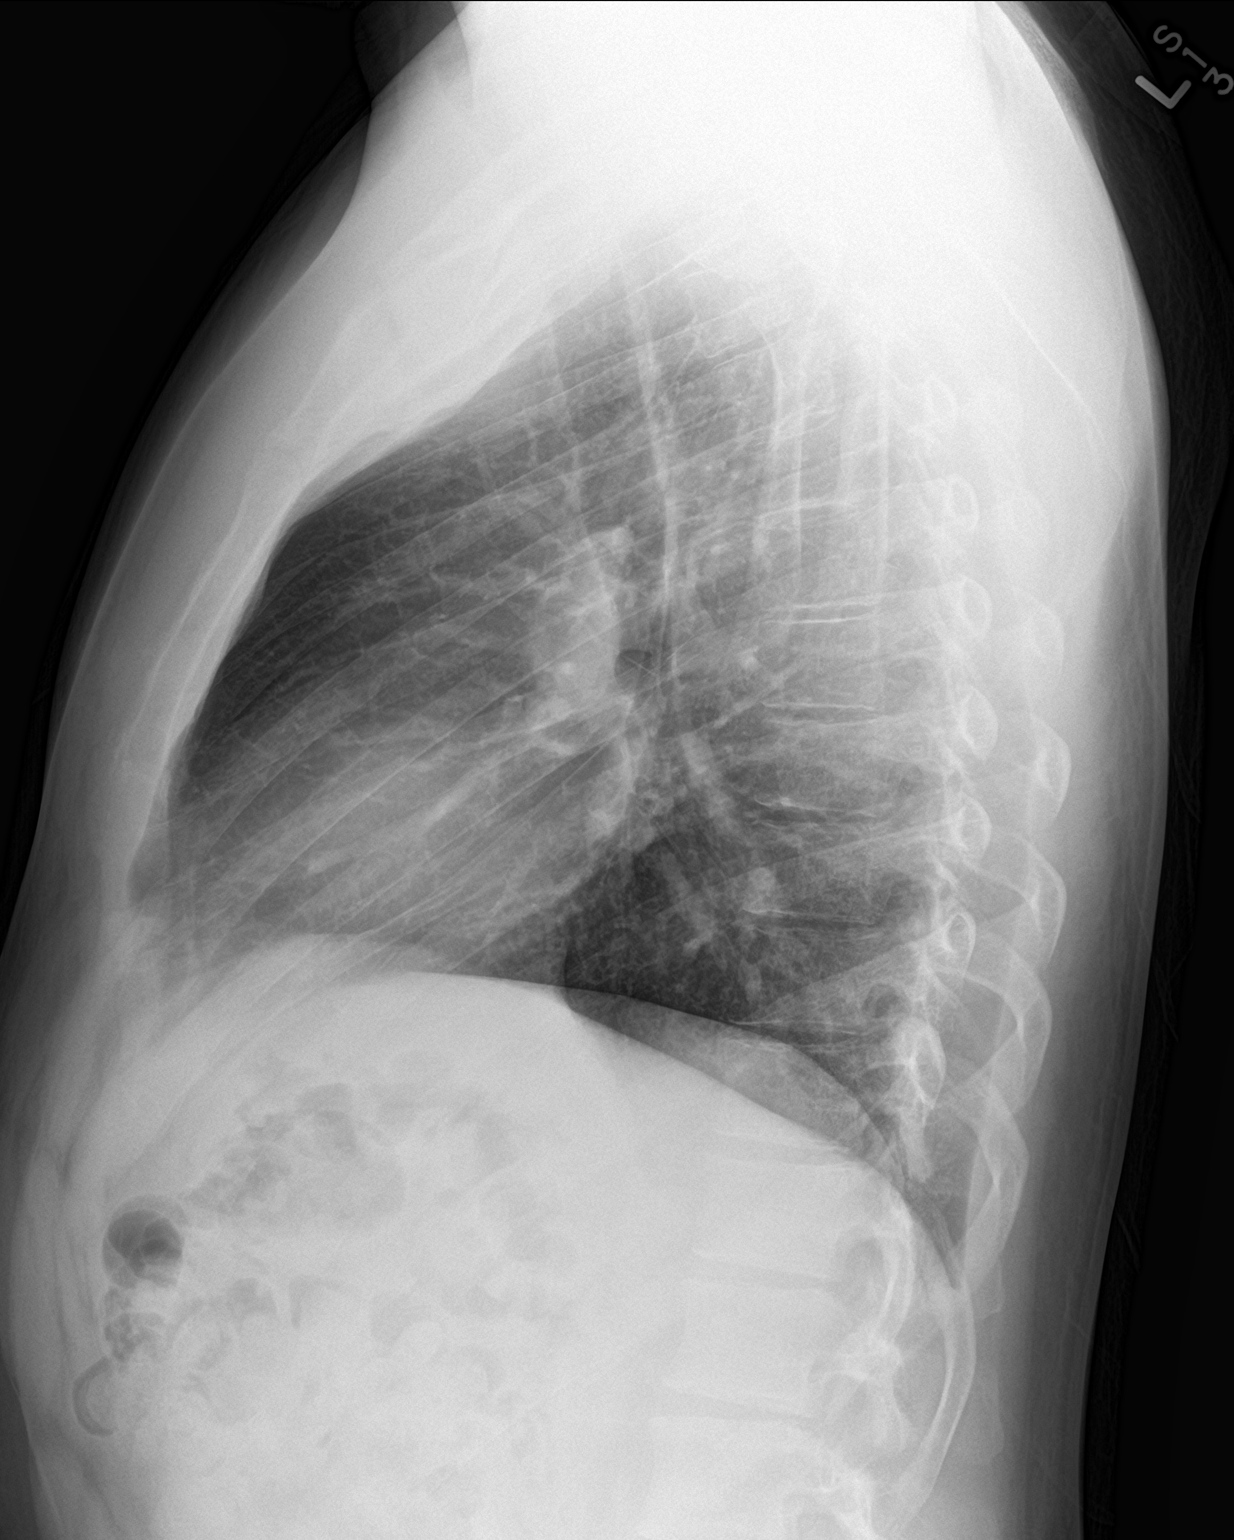

[2 of 2 positions shown; findings below may reference images not displayed]

FINDINGS: Lungs are clear. Heart size and pulmonary vascularity are normal. No
adenopathy. No bone lesions.
IMPRESSION: No edema or consolidation.

## 2017-07-28 DIAGNOSIS — M2550 Pain in unspecified joint: Secondary | ICD-10-CM | POA: Diagnosis not present

## 2017-07-28 DIAGNOSIS — Z0000 Encounter for general adult medical examination without abnormal findings: Secondary | ICD-10-CM | POA: Diagnosis not present

## 2017-07-28 DIAGNOSIS — Z136 Encounter for screening for cardiovascular disorders: Secondary | ICD-10-CM | POA: Diagnosis not present

## 2017-07-28 DIAGNOSIS — R202 Paresthesia of skin: Secondary | ICD-10-CM | POA: Diagnosis not present

## 2017-08-04 DIAGNOSIS — M549 Dorsalgia, unspecified: Secondary | ICD-10-CM | POA: Diagnosis not present

## 2017-08-04 DIAGNOSIS — R311 Benign essential microscopic hematuria: Secondary | ICD-10-CM | POA: Diagnosis not present

## 2017-08-12 DIAGNOSIS — R311 Benign essential microscopic hematuria: Secondary | ICD-10-CM | POA: Diagnosis not present

## 2017-08-12 DIAGNOSIS — N202 Calculus of kidney with calculus of ureter: Secondary | ICD-10-CM | POA: Diagnosis not present

## 2017-08-30 DIAGNOSIS — R030 Elevated blood-pressure reading, without diagnosis of hypertension: Secondary | ICD-10-CM | POA: Diagnosis not present

## 2017-10-07 DIAGNOSIS — Z23 Encounter for immunization: Secondary | ICD-10-CM | POA: Diagnosis not present

## 2017-11-17 DIAGNOSIS — H02846 Edema of left eye, unspecified eyelid: Secondary | ICD-10-CM | POA: Diagnosis not present

## 2018-02-15 DIAGNOSIS — R1084 Generalized abdominal pain: Secondary | ICD-10-CM | POA: Diagnosis not present

## 2018-02-15 DIAGNOSIS — N202 Calculus of kidney with calculus of ureter: Secondary | ICD-10-CM | POA: Diagnosis not present

## 2018-03-13 DIAGNOSIS — R635 Abnormal weight gain: Secondary | ICD-10-CM | POA: Diagnosis not present

## 2018-03-13 DIAGNOSIS — R140 Abdominal distension (gaseous): Secondary | ICD-10-CM | POA: Diagnosis not present

## 2018-06-15 DIAGNOSIS — H00025 Hordeolum internum left lower eyelid: Secondary | ICD-10-CM | POA: Diagnosis not present

## 2018-11-09 DIAGNOSIS — N202 Calculus of kidney with calculus of ureter: Secondary | ICD-10-CM | POA: Diagnosis not present

## 2019-01-11 DIAGNOSIS — L0390 Cellulitis, unspecified: Secondary | ICD-10-CM | POA: Diagnosis not present

## 2019-03-15 DIAGNOSIS — R202 Paresthesia of skin: Secondary | ICD-10-CM | POA: Diagnosis not present

## 2019-03-20 ENCOUNTER — Other Ambulatory Visit: Payer: Self-pay | Admitting: *Deleted

## 2019-03-20 DIAGNOSIS — R202 Paresthesia of skin: Secondary | ICD-10-CM

## 2019-05-08 ENCOUNTER — Encounter: Payer: 59 | Admitting: Neurology

## 2019-05-08 DIAGNOSIS — R030 Elevated blood-pressure reading, without diagnosis of hypertension: Secondary | ICD-10-CM | POA: Diagnosis not present

## 2019-05-08 DIAGNOSIS — Z0000 Encounter for general adult medical examination without abnormal findings: Secondary | ICD-10-CM | POA: Diagnosis not present

## 2019-05-08 DIAGNOSIS — Z23 Encounter for immunization: Secondary | ICD-10-CM | POA: Diagnosis not present

## 2019-05-08 DIAGNOSIS — Z13220 Encounter for screening for lipoid disorders: Secondary | ICD-10-CM | POA: Diagnosis not present

## 2019-05-29 ENCOUNTER — Encounter: Payer: 59 | Admitting: Neurology

## 2019-08-15 DIAGNOSIS — I10 Essential (primary) hypertension: Secondary | ICD-10-CM | POA: Diagnosis not present

## 2019-10-02 DIAGNOSIS — Z20828 Contact with and (suspected) exposure to other viral communicable diseases: Secondary | ICD-10-CM | POA: Diagnosis not present

## 2019-10-02 DIAGNOSIS — Z03818 Encounter for observation for suspected exposure to other biological agents ruled out: Secondary | ICD-10-CM | POA: Diagnosis not present

## 2019-10-02 DIAGNOSIS — Z7189 Other specified counseling: Secondary | ICD-10-CM | POA: Diagnosis not present

## 2019-10-05 DIAGNOSIS — I10 Essential (primary) hypertension: Secondary | ICD-10-CM | POA: Diagnosis not present

## 2019-10-05 DIAGNOSIS — Z23 Encounter for immunization: Secondary | ICD-10-CM | POA: Diagnosis not present

## 2019-10-05 DIAGNOSIS — R252 Cramp and spasm: Secondary | ICD-10-CM | POA: Diagnosis not present

## 2019-10-15 DIAGNOSIS — Z03818 Encounter for observation for suspected exposure to other biological agents ruled out: Secondary | ICD-10-CM | POA: Diagnosis not present

## 2019-10-22 DIAGNOSIS — Z03818 Encounter for observation for suspected exposure to other biological agents ruled out: Secondary | ICD-10-CM | POA: Diagnosis not present

## 2019-11-14 DIAGNOSIS — R0789 Other chest pain: Secondary | ICD-10-CM | POA: Diagnosis not present

## 2019-11-14 DIAGNOSIS — I10 Essential (primary) hypertension: Secondary | ICD-10-CM | POA: Diagnosis not present

## 2019-11-26 DIAGNOSIS — Z03818 Encounter for observation for suspected exposure to other biological agents ruled out: Secondary | ICD-10-CM | POA: Diagnosis not present

## 2019-11-29 DIAGNOSIS — Z03818 Encounter for observation for suspected exposure to other biological agents ruled out: Secondary | ICD-10-CM | POA: Diagnosis not present

## 2019-11-29 DIAGNOSIS — I10 Essential (primary) hypertension: Secondary | ICD-10-CM | POA: Diagnosis not present

## 2019-11-29 DIAGNOSIS — F439 Reaction to severe stress, unspecified: Secondary | ICD-10-CM | POA: Diagnosis not present

## 2019-12-03 DIAGNOSIS — I10 Essential (primary) hypertension: Secondary | ICD-10-CM | POA: Diagnosis not present

## 2019-12-03 DIAGNOSIS — F439 Reaction to severe stress, unspecified: Secondary | ICD-10-CM | POA: Diagnosis not present

## 2019-12-03 DIAGNOSIS — S29011A Strain of muscle and tendon of front wall of thorax, initial encounter: Secondary | ICD-10-CM | POA: Diagnosis not present

## 2020-01-01 DIAGNOSIS — L0292 Furuncle, unspecified: Secondary | ICD-10-CM | POA: Diagnosis not present

## 2020-01-10 ENCOUNTER — Ambulatory Visit: Payer: Self-pay

## 2020-01-14 ENCOUNTER — Ambulatory Visit: Payer: Self-pay | Attending: Family

## 2020-01-14 DIAGNOSIS — Z23 Encounter for immunization: Secondary | ICD-10-CM | POA: Insufficient documentation

## 2020-01-14 NOTE — Progress Notes (Signed)
   Covid-19 Vaccination Clinic  Name:  ALON MAZOR    MRN: [id number] DOB: March 15, 1981  01/14/2020  Mr. Rodeheaver was observed post Covid-19 immunization for 15 minutes without incidence. He was provided with Vaccine Information Sheet and instruction to access the V-Safe system.   Mr. Keo was instructed to call 911 with any severe reactions post vaccine: Marland Kitchen Difficulty breathing  . Swelling of your face and throat  . A fast heartbeat  . A bad rash all over your body  . Dizziness and weakness    Immunizations Administered    Name Date Dose VIS Date Route   Moderna COVID-19 Vaccine 01/14/2020 11:39 AM 0.5 mL 10/23/2019 Intramuscular   Manufacturer: Moderna   Lot: [id number]   NDC: 00379-444-61

## 2020-01-16 DIAGNOSIS — I10 Essential (primary) hypertension: Secondary | ICD-10-CM | POA: Diagnosis not present

## 2020-01-16 DIAGNOSIS — R310 Gross hematuria: Secondary | ICD-10-CM | POA: Diagnosis not present

## 2020-01-16 DIAGNOSIS — N202 Calculus of kidney with calculus of ureter: Secondary | ICD-10-CM | POA: Diagnosis not present

## 2020-01-22 DIAGNOSIS — R310 Gross hematuria: Secondary | ICD-10-CM | POA: Diagnosis not present

## 2020-02-12 ENCOUNTER — Ambulatory Visit: Payer: Self-pay | Attending: Family

## 2020-02-12 DIAGNOSIS — Z23 Encounter for immunization: Secondary | ICD-10-CM

## 2020-02-12 NOTE — Progress Notes (Signed)
   Covid-19 Vaccination Clinic  Name:  DHILAN BRAUER    MRN: [id number] DOB: 10-Aug-1981  02/12/2020  Mr. Gerstner was observed post Covid-19 immunization for 15 minutes without incident. He was provided with Vaccine Information Sheet and instruction to access the V-Safe system.   Mr. Sternberg was instructed to call 911 with any severe reactions post vaccine: Marland Kitchen Difficulty breathing  . Swelling of face and throat  . A fast heartbeat  . A bad rash all over body  . Dizziness and weakness   Immunizations Administered    Name Date Dose VIS Date Route   Moderna COVID-19 Vaccine 02/12/2020  2:44 PM 0.5 mL 10/23/2019 Intramuscular   Manufacturer: Moderna   Lot: [id number]   NDC: 20100-712-19

## 2020-07-21 DIAGNOSIS — R55 Syncope and collapse: Secondary | ICD-10-CM | POA: Diagnosis not present

## 2020-07-21 DIAGNOSIS — J302 Other seasonal allergic rhinitis: Secondary | ICD-10-CM | POA: Diagnosis not present

## 2020-07-21 DIAGNOSIS — I10 Essential (primary) hypertension: Secondary | ICD-10-CM | POA: Diagnosis not present

## 2020-07-21 DIAGNOSIS — R310 Gross hematuria: Secondary | ICD-10-CM | POA: Diagnosis not present

## 2020-07-22 DIAGNOSIS — R7989 Other specified abnormal findings of blood chemistry: Secondary | ICD-10-CM | POA: Diagnosis not present

## 2020-07-22 DIAGNOSIS — G44209 Tension-type headache, unspecified, not intractable: Secondary | ICD-10-CM | POA: Diagnosis not present

## 2020-07-25 DIAGNOSIS — R7989 Other specified abnormal findings of blood chemistry: Secondary | ICD-10-CM | POA: Diagnosis not present

## 2020-10-06 DIAGNOSIS — R059 Cough, unspecified: Secondary | ICD-10-CM | POA: Diagnosis not present

## 2020-10-06 DIAGNOSIS — J069 Acute upper respiratory infection, unspecified: Secondary | ICD-10-CM | POA: Diagnosis not present

## 2020-11-22 DIAGNOSIS — Z1152 Encounter for screening for COVID-19: Secondary | ICD-10-CM | POA: Diagnosis not present

## 2023-12-29 NOTE — Progress Notes (Signed)
 [facility] [phone]   [phone]  INITIAL CONSULT NOTE  Patient Care Team: Rexanne Ingle, MD as PCP - General (Internal Medicine)  Hematological/Oncological History # Leukopenia/Neutropenia 11/30/2023: WBC 2.4, Hgb 15.6, MCV 93.6, Plt 257. ANC 1.0  12/30/2023: establish care with Dr. Federico   CHIEF COMPLAINTS/PURPOSE OF CONSULTATION:  Leukopenia/Neutropenia   HISTORY OF PRESENTING ILLNESS:  Jimmy Orozco 43 y.o. male with medical history significant for GERD and hiatal hernia who presents for evaluation of leukopenia.   On review of the previous records Mr Lepage had labs collected on 11/30/2023 which showed WBC 2.4, Hgb 15.6, MCV 93.6, Plt 257. ANC 1.0.  Due to concern for his neutropenia was referred to hematology for further evaluation and management.  On exam today Mr Herms reports he has had low blood cells for several years and recently his doctor recommended he be evaluated by hematology.  He reports that about 1 month ago he stopped drinking alcohol.  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  He is unable to quantify how much but he stopped altogether 1 month ago.  He reports he is a lot of stress at work.  He reports that he eats a regular diet but has been trying to eat more salmon, fish, and veggies.  He does eat some occasional red meat.  He reports his energy levels have been okay.  He notes he has not had any recent issues with infection such as runny nose, sore throat, or cough.  He denies any recent pneumonias or sinus infections.  On further discussion he reports that his family history is remarkable for RA and borderline diabetes as well as hypertension in his mother and hypertension in his father.  He reports that his paternal grandfather had prostate cancer.  He has 2 children, 1 step and 1 biological.  He never smoked.  And just quit alcohol 1 month ago.  He currently works in [employer].  He otherwise  denies any fevers, chills, sweats, nausea, vomiting or diarrhea.  Full 10 point ROS is otherwise negative.  MEDICAL HISTORY:  Past Medical History:  Diagnosis Date   Allergy    GERD (gastroesophageal reflux disease)    Hiatal hernia     SURGICAL HISTORY: Past Surgical History:  Procedure Laterality Date   BRAVO PH STUDY N/A 10/06/2015   Procedure: BRAVO PH STUDY;  Surgeon: Gustav Shila GAILS, MD;  Location: [facility];  Service: Endoscopy;  Laterality: N/A;   EAR CYST EXCISION Left    ESOPHAGEAL MANOMETRY N/A 10/06/2015   Procedure: ESOPHAGEAL MANOMETRY (EM);  Surgeon: Gustav Shila GAILS, MD;  Location: [facility];  Service: Endoscopy;  Laterality: N/A;   ESOPHAGOGASTRODUODENOSCOPY (EGD) WITH PROPOFOL  N/A 10/06/2015   Procedure: ESOPHAGOGASTRODUODENOSCOPY (EGD) WITH PROPOFOL ;  Surgeon: Gustav Shila GAILS, MD;  Location: [facility];  Service: Endoscopy;  Laterality: N/A;    SOCIAL HISTORY: Social History   Socioeconomic History   Marital status: Married    Spouse name: Not on file   Number of children: 1   Years of education: Not on file   Highest education level: Not on file  Occupational History   Occupation: Furniture Conservator/restorer  Tobacco Use   Smoking status: Passive Smoke Exposure - Never Smoker   Smokeless tobacco: Never   Tobacco comments:    works in Tour [employer] not a smoker  Substance and Sexual Activity   Alcohol use: Yes    Alcohol/week: 0.0 standard drinks  of alcohol    Comment: occ. use   Drug use: No   Sexual activity: Not on file  Other Topics Concern   Not on file  Social History Narrative   Not on file   Social Drivers of Health   Financial Resource Strain: Not on file  Food Insecurity: Not on file  Transportation Needs: Not on file  Physical Activity: Not on file  Stress: Not on file  Social Connections: Not on file  Intimate Partner Violence: Not on file    FAMILY HISTORY: Family History  Problem Relation Age of Onset    Diabetes Mother    Hypertension Mother    Hypertension Father    Asthma Paternal Grandmother    Hypertension Paternal Grandmother    Hypertension Paternal Grandfather    Hypertension Maternal Grandfather     ALLERGIES:  is allergic to iodine.  MEDICATIONS:  Current Outpatient Medications  Medication Sig Dispense Refill   amLODipine (NORVASC) 5 MG tablet Take 5 mg by mouth daily.     fluticasone (FLONASE) 50 MCG/ACT nasal spray Place 2 sprays into both nostrils daily as needed for allergies.   3   No current facility-administered medications for this visit.    REVIEW OF SYSTEMS:   All other systems were reviewed with the patient and are negative.  PHYSICAL EXAMINATION:  Vitals:   12/30/23 0913  BP: (!) 139/101  Pulse: 78  Resp: 15  Temp: 98.1 F (36.7 C)  SpO2: 100%   Filed Weights   12/30/23 0913  Weight: 192 lb (87.1 kg)    GENERAL: well appearing middle aged African American male in NAD  SKIN: skin color, texture, turgor are normal, no rashes or significant lesions EYES: conjunctiva are pink and non-injected, sclera clear LUNGS: clear to auscultation and percussion with normal breathing effort HEART: regular rate & rhythm and no murmurs and no lower extremity edema Musculoskeletal: no cyanosis of digits and no clubbing  PSYCH: alert & oriented x 3, fluent speech NEURO: no focal motor/sensory deficits  LABORATORY DATA:  I have reviewed the data as listed    Latest Ref Rng & Units 12/30/2023    9:56 AM 06/30/2013    6:22 PM  CBC  WBC 4.0 - 10.5 K/uL 2.7  4.7   Hemoglobin 13.0 - 17.0 g/dL 85.1  86.1   Hematocrit 39.0 - 52.0 % 44.7  41.5   Platelets 150 - 400 K/uL 278  239        Latest Ref Rng & Units 06/30/2013    6:22 PM  CMP  Glucose 70 - 99 mg/dL 78   BUN 6 - 23 mg/dL 13   Creatinine 9.49 - 1.35 mg/dL 8.90   Sodium 864 - 854 mEq/L 137   Potassium 3.5 - 5.1 mEq/L 3.5   Chloride 96 - 112 mEq/L 98   CO2 19 - 32 mEq/L 27   Calcium 8.4 - 10.5 mg/dL 9.4       ASSESSMENT & PLAN Jimmy Orozco Portal 43 y.o. male with medical history significant for GERD and hiatal hernia who presents for evaluation of leukopenia.   After review of the labs, review of the records, and discussion with the patient the patients findings are most consistent with neutropenia 2/2 to EtOH consumption.   The differential for neutropenia includes nutritional deficiency, inflammatory disorder, medication side effect, infectious etiology, or congenital condition (such as benign ethnic neutropenia). The patient is not taking any medications known to cause neutropenia. Workup for this condition includes  viral serologies (HIV, Hep B and Hep C),  vitamin b12/folate, and inflammatory markers with ESR and CRP.    A common cause for low WBC is benign ethnic neutropenia (BEN). This is a condition whereby individuals of African or Mediterranean descent have lower WBC than the general population. The condition typically has an absolute neutrophil count (ANC) between 1000-1500 with no recurrent infections. Patient with this condition have normal functioning immune systems and have no consequences as a result of their low ANC. BEN is a diagnosis of exclusion, so it would require the full above workup to make.    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  --repeat CBC and CMP  --infectious serology testing with Hep B, Hep C, and HIV (HIV negative on outside labs) --nutritional evaluation with Vitamin b12, folate  --inflammatory workup with ESR and CRP  --TSH WNL on outside labs.  --RTC in 6 month's time or sooner if there is an issue with the above labs.    Orders Placed This Encounter  Procedures   CBC with Differential (Cancer Center Only)    Standing Status:   Future    Number of Occurrences:   1    Expiration Date:   12/29/2024   CMP (Cancer Center only)    Standing Status:   Future     Number of Occurrences:   1    Expiration Date:   12/29/2024   Lactate dehydrogenase (LDH)    Standing Status:   Future    Number of Occurrences:   1    Expiration Date:   12/29/2024   Vitamin B12    Standing Status:   Future    Number of Occurrences:   1    Expiration Date:   12/29/2024   Methylmalonic acid, serum    Standing Status:   Future    Number of Occurrences:   1    Expiration Date:   12/29/2024   Folate, Serum    Standing Status:   Future    Number of Occurrences:   1    Expiration Date:   12/29/2024   Sedimentation rate    Standing Status:   Future    Number of Occurrences:   1    Expiration Date:   12/29/2024   C-reactive protein    Standing Status:   Future    Number of Occurrences:   1    Expiration Date:   12/29/2024   Hepatitis C antibody    Standing Status:   Future    Number of Occurrences:   1    Expiration Date:   12/29/2024   Hepatitis B surface antigen    Standing Status:   Future    Number of Occurrences:   1    Expiration Date:   12/29/2024   Hepatitis B surface antibody    Standing Status:   Future    Number of Occurrences:   1    Expiration Date:   12/29/2024   Hepatitis B core antibody, total    Standing Status:   Future    Number of Occurrences:   1    Expiration Date:   12/29/2024    All questions were answered. The patient knows to call the clinic with any problems, questions or concerns.  A total of more than 60 minutes were spent on this encounter with face-to-face time and non-face-to-face time, including preparing to see the  patient, ordering tests and/or medications, counseling the patient and coordination of care as outlined above.   Norleen IVAR Kidney, MD Department of Hematology/Oncology [facility] Phone: [phone] Pager: [phone] Email: [email or web address]  12/30/2023 10:36 AM

## 2023-12-30 ENCOUNTER — Inpatient Hospital Stay: Payer: 59

## 2023-12-30 ENCOUNTER — Inpatient Hospital Stay: Payer: 59 | Attending: Hematology and Oncology | Admitting: Hematology and Oncology

## 2023-12-30 VITALS — BP 139/101 | HR 78 | Temp 98.1°F | Resp 15 | Wt 192.0 lb

## 2023-12-30 DIAGNOSIS — Z7289 Other problems related to lifestyle: Secondary | ICD-10-CM | POA: Diagnosis not present

## 2023-12-30 DIAGNOSIS — K449 Diaphragmatic hernia without obstruction or gangrene: Secondary | ICD-10-CM

## 2023-12-30 DIAGNOSIS — Z91041 Radiographic dye allergy status: Secondary | ICD-10-CM | POA: Diagnosis not present

## 2023-12-30 DIAGNOSIS — Z833 Family history of diabetes mellitus: Secondary | ICD-10-CM | POA: Diagnosis not present

## 2023-12-30 DIAGNOSIS — K219 Gastro-esophageal reflux disease without esophagitis: Secondary | ICD-10-CM | POA: Insufficient documentation

## 2023-12-30 DIAGNOSIS — Z79899 Other long term (current) drug therapy: Secondary | ICD-10-CM | POA: Insufficient documentation

## 2023-12-30 DIAGNOSIS — Z888 Allergy status to other drugs, medicaments and biological substances status: Secondary | ICD-10-CM | POA: Diagnosis not present

## 2023-12-30 DIAGNOSIS — D700 Congenital agranulocytosis: Secondary | ICD-10-CM

## 2023-12-30 DIAGNOSIS — Z566 Other physical and mental strain related to work: Secondary | ICD-10-CM | POA: Insufficient documentation

## 2023-12-30 DIAGNOSIS — Z8249 Family history of ischemic heart disease and other diseases of the circulatory system: Secondary | ICD-10-CM | POA: Insufficient documentation

## 2023-12-30 DIAGNOSIS — D709 Neutropenia, unspecified: Secondary | ICD-10-CM | POA: Insufficient documentation

## 2023-12-30 DIAGNOSIS — Z825 Family history of asthma and other chronic lower respiratory diseases: Secondary | ICD-10-CM | POA: Diagnosis not present

## 2023-12-30 LAB — CMP (CANCER CENTER ONLY)
ALT: 25 U/L (ref 0–44)
AST: 20 U/L (ref 15–41)
Albumin: 4.4 g/dL (ref 3.5–5.0)
Alkaline Phosphatase: 50 U/L (ref 38–126)
Anion gap: 5 (ref 5–15)
BUN: 9 mg/dL (ref 6–20)
CO2: 30 mmol/L (ref 22–32)
Calcium: 9.8 mg/dL (ref 8.9–10.3)
Chloride: 103 mmol/L (ref 98–111)
Creatinine: 0.87 mg/dL (ref 0.61–1.24)
GFR, Estimated: >60 mL/min (ref >60)
Glucose, Bld: 97 mg/dL (ref 70–99)
Potassium: 4.1 mmol/L (ref 3.5–5.1)
Sodium: 138 mmol/L (ref 135–145)
Total Bilirubin: 0.3 mg/dL (ref 0.0–1.2)
Total Protein: 8.2 g/dL — ABNORMAL HIGH (ref 6.5–8.1)

## 2023-12-30 LAB — CBC WITH DIFFERENTIAL (CANCER CENTER ONLY)
Abs Immature Granulocytes: 0 10*3/uL (ref 0.00–0.07)
Basophils Absolute: 0 10*3/uL (ref 0.0–0.1)
Basophils Relative: 1 %
Eosinophils Absolute: 0 10*3/uL (ref 0.0–0.5)
Eosinophils Relative: 1 %
HCT: 44.7 % (ref 39.0–52.0)
Hemoglobin: 14.8 g/dL (ref 13.0–17.0)
Immature Granulocytes: 0 %
Lymphocytes Relative: 42 %
Lymphs Abs: 1.2 10*3/uL (ref 0.7–4.0)
MCH: 30.2 pg (ref 26.0–34.0)
MCHC: 33.1 g/dL (ref 30.0–36.0)
MCV: 91.2 fL (ref 80.0–100.0)
Monocytes Absolute: 0.4 10*3/uL (ref 0.1–1.0)
Monocytes Relative: 14 %
Neutro Abs: 1.1 10*3/uL — ABNORMAL LOW (ref 1.7–7.7)
Neutrophils Relative %: 42 %
Platelet Count: 278 10*3/uL (ref 150–400)
RBC: 4.9 MIL/uL (ref 4.22–5.81)
RDW: 13.4 % (ref 11.5–15.5)
WBC Count: 2.7 10*3/uL — ABNORMAL LOW (ref 4.0–10.5)
nRBC: 0 % (ref 0.0–0.2)

## 2023-12-30 LAB — SEDIMENTATION RATE: Sed Rate: 5 mm/h (ref 0–16)

## 2023-12-30 LAB — HEPATITIS B SURFACE ANTIGEN: Hepatitis B Surface Ag: NONREACTIVE

## 2023-12-30 LAB — C-REACTIVE PROTEIN: CRP: 0.6 mg/dL (ref <1.0)

## 2023-12-30 LAB — HEPATITIS B SURFACE ANTIBODY,QUALITATIVE: Hep B S Ab: REACTIVE — AB

## 2023-12-30 LAB — LACTATE DEHYDROGENASE: LDH: 183 U/L (ref 98–192)

## 2023-12-30 LAB — FOLATE: Folate: 11 ng/mL (ref >5.9)

## 2023-12-30 LAB — VITAMIN B12: Vitamin B-12: 756 pg/mL (ref 180–914)

## 2023-12-30 LAB — HEPATITIS C ANTIBODY: HCV Ab: NONREACTIVE

## 2023-12-31 LAB — HEPATITIS B CORE ANTIBODY, TOTAL: HEP B CORE AB: POSITIVE — AB

## 2024-01-03 LAB — METHYLMALONIC ACID, SERUM: Methylmalonic Acid, Quantitative: 109 nmol/L (ref 0–378)

## 2024-01-04 ENCOUNTER — Telehealth: Payer: Self-pay | Admitting: *Deleted

## 2024-01-04 ENCOUNTER — Other Ambulatory Visit: Payer: Self-pay | Admitting: Hematology and Oncology

## 2024-01-04 DIAGNOSIS — D700 Congenital agranulocytosis: Secondary | ICD-10-CM

## 2024-01-04 NOTE — Telephone Encounter (Signed)
-----   Message from Ulysees Barns IV sent at 01/04/2024  8:42 AM EST ----- Please let Mr. Ruggieri know that his testing did show a past Hepatitis B infection (not active), but no other abnormalities in the bloodwork. I would recommend a US of the liver (ordered) to assess for liver disease as the cause of his low WBC. Please let him know to expect a call from [place]. ----- Message ----- From: Interface, Lab In [place] Sent: 12/30/2023  10:27 AM EST To: Jaci Standard, MD

## 2024-01-04 NOTE — Telephone Encounter (Signed)
TCT patient regarding recent lab results. Spoke with him. Advised that his testing did show a past Hepatitis B infection (not active), but no other abnormalities in the bloodwork. I would recommend a US of the liver (ordered) to assess for liver disease as the cause of his low WBC. Please let him know to expect a call from [place]. He voiced understanding. Reinforced with him that he does NOT have an active case of Hep B-just that he was exposed in the past.  He understands about the [place] which is scheduled for 01/09/24. Advised that I would likely call him later next week with that result. He again voiced understanding.

## 2024-01-09 ENCOUNTER — Ambulatory Visit (HOSPITAL_COMMUNITY)
Admission: RE | Admit: 2024-01-09 | Discharge: 2024-01-09 | Disposition: A | Discharge status: Home / Self Care | Payer: 59 | Source: Ambulatory Visit | Attending: Hematology and Oncology | Admitting: Hematology and Oncology

## 2024-01-09 DIAGNOSIS — D700 Congenital agranulocytosis: Secondary | ICD-10-CM | POA: Diagnosis present

## 2024-01-12 ENCOUNTER — Telehealth: Payer: Self-pay | Admitting: *Deleted

## 2024-01-12 NOTE — Telephone Encounter (Signed)
-----   Message from Ulysees Barns IV sent at 01/12/2024  9:40 AM EST ----- Please let Jimmy Orozco know that the ultrasound of his liver did not show any evidence of liver disease.  As for now we do not have a clear reason why his white blood cell count is low.  I would recommend we continue to monitor to make sure it does not worsen,  please request a follow-up visit with him in 6 months with Karena Addison. ----- Message ----- From: Leory Plowman, Rad Results In Sent: 01/09/2024   8:44 AM EST To: Jaci Standard, MD

## 2024-01-12 NOTE — Telephone Encounter (Signed)
 TCT patient regarding recent ultrasound results. Spoke with him. Advised that the ultrasound of his liver did not show any evidence of liver disease. As for now we do not have a clear reason why his white blood cell count is low. Dr. Leonides Schanz recommends we continue to monitor to make sure it does not worsen, please request a follow-up visit with him in 6 months with Karena Addison.  Pt voiced understanding and is agreeable to this plan.

## 2024-01-13 ENCOUNTER — Telehealth: Payer: Self-pay | Admitting: Physician Assistant

## 2024-01-13 NOTE — Telephone Encounter (Signed)
 Scheduled patients appts per IB message received on 2/20. Patient is aware of all appointment details.

## 2024-02-29 ENCOUNTER — Other Ambulatory Visit: Payer: Self-pay | Admitting: Gastroenterology

## 2024-02-29 DIAGNOSIS — R1011 Right upper quadrant pain: Secondary | ICD-10-CM

## 2024-03-08 ENCOUNTER — Ambulatory Visit
Admission: RE | Admit: 2024-03-08 | Discharge: 2024-03-08 | Disposition: A | Discharge status: Home / Self Care | Source: Ambulatory Visit | Attending: Gastroenterology | Admitting: Gastroenterology

## 2024-03-08 DIAGNOSIS — R1011 Right upper quadrant pain: Secondary | ICD-10-CM

## 2024-07-05 ENCOUNTER — Other Ambulatory Visit: Payer: Self-pay | Admitting: Physician Assistant

## 2024-07-05 DIAGNOSIS — D700 Congenital agranulocytosis: Secondary | ICD-10-CM

## 2024-07-05 NOTE — Progress Notes (Signed)
 [facility] Telephone:([phone]   [phone]  PROGRESS NOTE  Patient Care Team: Rexanne Ingle, MD as PCP - General (Internal Medicine)  Hematological/Oncological History # Leukopenia/Neutropenia 11/30/2023: WBC 2.4, Hgb 15.6, MCV 93.6, Plt 257. ANC 1.0  12/30/2023: establish care with Dr. Federico   CHIEF COMPLAINTS/PURPOSE OF CONSULTATION:  Leukopenia/Neutropenia   HISTORY OF PRESENTING ILLNESS:  Jimmy Orozco 43 y.o. male returns for a follow up for neutropenia/leukopenia. He is unaccompanied for this visit.   On exam today Mr Lupe reports he is doing well and over the last several weeks he has made a big effort to improve his diet and minimize alcohol consumption.  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  He denies any recurrent infections or fevers. He denies having a runny nose, sore throat, or cough.  He denies any recent pneumonias or sinus infections. He otherwise denies any fevers, chills, sweats, nausea, vomiting or diarrhea.  Full 10 point ROS is otherwise negative.  MEDICAL HISTORY:  Past Medical History:  Diagnosis Date   Allergy    GERD (gastroesophageal reflux disease)    Hiatal hernia     SURGICAL HISTORY: Past Surgical History:  Procedure Laterality Date   BRAVO PH STUDY N/A 10/06/2015   Procedure: BRAVO PH STUDY;  Surgeon: Gustav Shila GAILS, MD;  Location: [facility];  Service: Endoscopy;  Laterality: N/A;   EAR CYST EXCISION Left    ESOPHAGEAL MANOMETRY N/A 10/06/2015   Procedure: ESOPHAGEAL MANOMETRY (EM);  Surgeon: Gustav Shila GAILS, MD;  Location: [facility];  Service: Endoscopy;  Laterality: N/A;   ESOPHAGOGASTRODUODENOSCOPY (EGD) WITH PROPOFOL N/A 10/06/2015   Procedure: ESOPHAGOGASTRODUODENOSCOPY (EGD) WITH PROPOFOL;  Surgeon: Gustav Shila GAILS, MD;  Location: [facility];  Service: Endoscopy;  Laterality: N/A;    SOCIAL  HISTORY: Social History   Socioeconomic History   Marital status: Married    Spouse name: Not on file   Number of children: 1   Years of education: Not on file   Highest education level: Not on file  Occupational History   Occupation: Furniture conservator/restorer  Tobacco Use   Smoking status: Passive Smoke Exposure - Never Smoker   Smokeless tobacco: Never   Tobacco comments:    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 use   Drug use: No   Sexual activity: Not on file  Other Topics Concern   Not on file  Social History Narrative   Not on file   Social Drivers of Health   Financial Resource Strain: Not on file  Food Insecurity: Not on file  Transportation Needs: Not on file  Physical Activity: Not on file  Stress: Not on file  Social Connections: Not on file  Intimate Partner Violence: Not on file    FAMILY HISTORY: Family History  Problem Relation Age of Onset   Diabetes Mother    Hypertension Mother    Hypertension Father    Asthma Paternal Grandmother    Hypertension Paternal Grandmother    Hypertension Paternal Grandfather    Hypertension Maternal Grandfather     ALLERGIES:  is allergic to iodine.  MEDICATIONS:  Current Outpatient Medications  Medication Sig Dispense Refill   amLODipine (NORVASC) 5 MG tablet Take 5 mg by mouth daily.     fluticasone (  FLONASE) 50 MCG/ACT nasal spray Place 2 sprays into both nostrils daily as needed for allergies.   3   No current facility-administered medications for this visit.    REVIEW OF SYSTEMS:   All other systems were reviewed with the patient and are negative.  PHYSICAL EXAMINATION:  Vitals:   07/06/24 0847  BP: (!) 132/92  Pulse: 88  Resp: 20  Temp: (!) 97.3 F (36.3 C)  SpO2: 99%    Filed Weights   07/06/24 0847  Weight: 178 lb 3.2 oz (80.8 kg)     GENERAL: well appearing middle aged  [place] American male in NAD  SKIN: skin color, texture, turgor are normal, no rashes or significant lesions EYES: conjunctiva are pink and non-injected, sclera clear LUNGS: clear to auscultation and percussion with normal breathing effort HEART: regular rate & rhythm and no murmurs and no lower extremity edema Musculoskeletal: no cyanosis of digits and no clubbing  PSYCH: alert & oriented x 3, fluent speech NEURO: no focal motor/sensory deficits  LABORATORY DATA:  I have reviewed the data as listed    Latest Ref Rng & Units 07/06/2024    8:21 AM 12/30/2023    9:56 AM 06/30/2013    6:22 PM  CBC  WBC 4.0 - 10.5 K/uL 3.3  2.7  4.7   Hemoglobin 13.0 - 17.0 g/dL 84.1  85.1  86.1   Hematocrit 39.0 - 52.0 % 47.6  44.7  41.5   Platelets 150 - 400 K/uL 252  278  239        Latest Ref Rng & Units 07/06/2024    8:21 AM 12/30/2023    9:56 AM 06/30/2013    6:22 PM  CMP  Glucose 70 - 99 mg/dL 97  97  78   BUN 6 - 20 mg/dL 11  9  13    Creatinine 0.61 - 1.24 mg/dL 9.01  9.12  8.90   Sodium 135 - 145 mmol/L 138  138  137   Potassium 3.5 - 5.1 mmol/L 4.9  4.1  3.5   Chloride 98 - 111 mmol/L 102  103  98   CO2 22 - 32 mmol/L 29  30  27    Calcium 8.9 - 10.3 mg/dL 9.9  9.8  9.4   Total Protein 6.5 - 8.1 g/dL 8.2  8.2    Total Bilirubin 0.0 - 1.2 mg/dL 0.5  0.3    Alkaline Phos 38 - 126 U/L 49  50    AST 15 - 41 U/L 19  20    ALT 0 - 44 U/L 23  25       ASSESSMENT & PLAN Jimmy Orozco is a 43 y.o. male with medical history significant for GERD and hiatal hernia who presents for evaluation of leukopenia.   After review of the labs, review of the records, and discussion with the patient the patients findings are most consistent with neutropenia 2/2 to EtOH consumption.   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  --Workup from 12/2023 showed outside of past hepatitis B infection (not active), there  was no evidence nutritional deficiencies, hepatitis C, or inflammation. --TSH WNL on outside labs.  --US  liver from 03/08/2024 showed no evidence of liver disease. But recent RUQ US  showed signs of fatty liver.  --Labs today show improvement of leukopenia and neutropenia with WBC 3.3 and ANC of 1.5.  No other cytopenias is detected today. --  Recommend to monitor at this time and minimize alcohol consumption. --RT in 6 months for continued monitoring of his labs.    No orders of the defined types were placed in this encounter.   All questions were answered. The patient knows to call the clinic with any problems, questions or concerns.   I have spent a total of 25 minutes minutes of face-to-face and non-face-to-face time, preparing to see the patient,  performing a medically appropriate examination, counseling and educating the patient,  documenting clinical information in the electronic health record, independently interpreting results and communicating results to the patient, and care coordination.   Johnston Police PA-C Dept of Hematology and Oncology [facility] Phone: [phone]   07/06/2024 9:34 AM

## 2024-07-06 ENCOUNTER — Inpatient Hospital Stay: Payer: 59 | Attending: Hematology and Oncology

## 2024-07-06 ENCOUNTER — Inpatient Hospital Stay (HOSPITAL_BASED_OUTPATIENT_CLINIC_OR_DEPARTMENT_OTHER): Payer: 59 | Admitting: Physician Assistant

## 2024-07-06 VITALS — BP 132/92 | HR 88 | Temp 97.3°F | Resp 20 | Wt 178.2 lb

## 2024-07-06 DIAGNOSIS — Z825 Family history of asthma and other chronic lower respiratory diseases: Secondary | ICD-10-CM | POA: Insufficient documentation

## 2024-07-06 DIAGNOSIS — K219 Gastro-esophageal reflux disease without esophagitis: Secondary | ICD-10-CM | POA: Diagnosis not present

## 2024-07-06 DIAGNOSIS — Z8249 Family history of ischemic heart disease and other diseases of the circulatory system: Secondary | ICD-10-CM | POA: Diagnosis not present

## 2024-07-06 DIAGNOSIS — Z833 Family history of diabetes mellitus: Secondary | ICD-10-CM | POA: Diagnosis not present

## 2024-07-06 DIAGNOSIS — Z8619 Personal history of other infectious and parasitic diseases: Secondary | ICD-10-CM | POA: Insufficient documentation

## 2024-07-06 DIAGNOSIS — D709 Neutropenia, unspecified: Secondary | ICD-10-CM | POA: Insufficient documentation

## 2024-07-06 DIAGNOSIS — Z79899 Other long term (current) drug therapy: Secondary | ICD-10-CM | POA: Diagnosis not present

## 2024-07-06 DIAGNOSIS — Z888 Allergy status to other drugs, medicaments and biological substances status: Secondary | ICD-10-CM | POA: Insufficient documentation

## 2024-07-06 DIAGNOSIS — D700 Congenital agranulocytosis: Secondary | ICD-10-CM | POA: Diagnosis not present

## 2024-07-06 LAB — CMP (CANCER CENTER ONLY)
ALT: 23 U/L (ref 0–44)
AST: 19 U/L (ref 15–41)
Albumin: 4.7 g/dL (ref 3.5–5.0)
Alkaline Phosphatase: 49 U/L (ref 38–126)
Anion gap: 7 (ref 5–15)
BUN: 11 mg/dL (ref 6–20)
CO2: 29 mmol/L (ref 22–32)
Calcium: 9.9 mg/dL (ref 8.9–10.3)
Chloride: 102 mmol/L (ref 98–111)
Creatinine: 0.98 mg/dL (ref 0.61–1.24)
GFR, Estimated: >60 mL/min (ref >60)
Glucose, Bld: 97 mg/dL (ref 70–99)
Potassium: 4.9 mmol/L (ref 3.5–5.1)
Sodium: 138 mmol/L (ref 135–145)
Total Bilirubin: 0.5 mg/dL (ref 0.0–1.2)
Total Protein: 8.2 g/dL — ABNORMAL HIGH (ref 6.5–8.1)

## 2024-07-06 LAB — CBC WITH DIFFERENTIAL (CANCER CENTER ONLY)
Abs Immature Granulocytes: 0 K/uL (ref 0.00–0.07)
Basophils Absolute: 0 K/uL (ref 0.0–0.1)
Basophils Relative: 1 %
Eosinophils Absolute: 0 K/uL (ref 0.0–0.5)
Eosinophils Relative: 1 %
HCT: 47.6 % (ref 39.0–52.0)
Hemoglobin: 15.8 g/dL (ref 13.0–17.0)
Immature Granulocytes: 0 %
Lymphocytes Relative: 41 %
Lymphs Abs: 1.4 K/uL (ref 0.7–4.0)
MCH: 28.8 pg (ref 26.0–34.0)
MCHC: 33.2 g/dL (ref 30.0–36.0)
MCV: 86.7 fL (ref 80.0–100.0)
Monocytes Absolute: 0.4 K/uL (ref 0.1–1.0)
Monocytes Relative: 13 %
Neutro Abs: 1.5 K/uL — ABNORMAL LOW (ref 1.7–7.7)
Neutrophils Relative %: 44 %
Platelet Count: 252 K/uL (ref 150–400)
RBC: 5.49 MIL/uL (ref 4.22–5.81)
RDW: 13.8 % (ref 11.5–15.5)
WBC Count: 3.3 K/uL — ABNORMAL LOW (ref 4.0–10.5)
nRBC: 0 % (ref 0.0–0.2)

## 2025-01-02 ENCOUNTER — Inpatient Hospital Stay: Admitting: Physician Assistant

## 2025-01-02 ENCOUNTER — Inpatient Hospital Stay
